# Patient Record
Sex: Male | Born: 1980 | Race: White | Hispanic: No | Marital: Married | Smoking: Current every day smoker
Health system: Southern US, Community
[De-identification: ages and names within clinical notes are randomized; demographics above are authoritative.]

---

## 2015-05-07 ENCOUNTER — Emergency Department (HOSPITAL_COMMUNITY): Payer: BLUE CROSS/BLUE SHIELD

## 2015-05-07 ENCOUNTER — Emergency Department (HOSPITAL_COMMUNITY)
Admission: EM | Admit: 2015-05-07 | Discharge: 2015-05-07 | Disposition: A | Discharge status: Home / Self Care | Payer: BLUE CROSS/BLUE SHIELD | Attending: Emergency Medicine | Admitting: Emergency Medicine

## 2015-05-07 ENCOUNTER — Encounter (HOSPITAL_COMMUNITY): Payer: Self-pay | Admitting: *Deleted

## 2015-05-07 DIAGNOSIS — Z720 Tobacco use: Secondary | ICD-10-CM | POA: Diagnosis not present

## 2015-05-07 DIAGNOSIS — R42 Dizziness and giddiness: Secondary | ICD-10-CM | POA: Diagnosis not present

## 2015-05-07 DIAGNOSIS — K0889 Other specified disorders of teeth and supporting structures: Secondary | ICD-10-CM

## 2015-05-07 DIAGNOSIS — K029 Dental caries, unspecified: Secondary | ICD-10-CM | POA: Insufficient documentation

## 2015-05-07 DIAGNOSIS — K088 Other specified disorders of teeth and supporting structures: Secondary | ICD-10-CM | POA: Insufficient documentation

## 2015-05-07 DIAGNOSIS — M542 Cervicalgia: Secondary | ICD-10-CM | POA: Insufficient documentation

## 2015-05-07 DIAGNOSIS — R6884 Jaw pain: Secondary | ICD-10-CM | POA: Diagnosis present

## 2015-05-07 DIAGNOSIS — K047 Periapical abscess without sinus: Secondary | ICD-10-CM | POA: Insufficient documentation

## 2015-05-07 DIAGNOSIS — R51 Headache: Secondary | ICD-10-CM | POA: Diagnosis not present

## 2015-05-07 LAB — CBC WITH DIFFERENTIAL/PLATELET
BASOS ABS: 0 10*3/uL (ref 0.0–0.1)
Basophils Relative: 0 % (ref 0–1)
Eosinophils Absolute: 0.1 10*3/uL (ref 0.0–0.7)
Eosinophils Relative: 1 % (ref 0–5)
HEMATOCRIT: 42.3 % (ref 39.0–52.0)
Hemoglobin: 14.3 g/dL (ref 13.0–17.0)
LYMPHS ABS: 1.5 10*3/uL (ref 0.7–4.0)
Lymphocytes Relative: 11 % — ABNORMAL LOW (ref 12–46)
MCH: 30.9 pg (ref 26.0–34.0)
MCHC: 33.8 g/dL (ref 30.0–36.0)
MCV: 91.4 fL (ref 78.0–100.0)
MONOS PCT: 7 % (ref 3–12)
Monocytes Absolute: 1 10*3/uL (ref 0.1–1.0)
NEUTROS PCT: 81 % — AB (ref 43–77)
Neutro Abs: 11.3 10*3/uL — ABNORMAL HIGH (ref 1.7–7.7)
Platelets: 242 10*3/uL (ref 150–400)
RBC: 4.63 MIL/uL (ref 4.22–5.81)
RDW: 12.7 % (ref 11.5–15.5)
WBC: 13.8 10*3/uL — ABNORMAL HIGH (ref 4.0–10.5)

## 2015-05-07 LAB — COMPREHENSIVE METABOLIC PANEL
ALT: 24 U/L (ref 17–63)
AST: 22 U/L (ref 15–41)
Albumin: 3.9 g/dL (ref 3.5–5.0)
Alkaline Phosphatase: 59 U/L (ref 38–126)
Anion gap: 10 (ref 5–15)
BUN: 6 mg/dL (ref 6–20)
CO2: 27 mmol/L (ref 22–32)
Calcium: 8.9 mg/dL (ref 8.9–10.3)
Chloride: 103 mmol/L (ref 101–111)
Creatinine, Ser: 0.83 mg/dL (ref 0.61–1.24)
GFR calc Af Amer: >60 mL/min (ref >60)
GFR calc non Af Amer: >60 mL/min (ref >60)
Glucose, Bld: 96 mg/dL (ref 65–99)
POTASSIUM: 3.6 mmol/L (ref 3.5–5.1)
Sodium: 140 mmol/L (ref 135–145)
Total Bilirubin: 0.7 mg/dL (ref 0.3–1.2)
Total Protein: 7.8 g/dL (ref 6.5–8.1)

## 2015-05-07 MED ORDER — CLINDAMYCIN HCL 150 MG PO CAPS: 450.0000 mg | ORAL_CAPSULE | Freq: Three times a day (TID) | ORAL | Status: AC

## 2015-05-07 MED ORDER — IOHEXOL 300 MG/ML  SOLN
80.0000 mL | Freq: Once | INTRAMUSCULAR | Status: AC | PRN
  Administered 2015-05-07: 80 mL via INTRAVENOUS

## 2015-05-07 MED ORDER — LIDOCAINE-EPINEPHRINE (PF) 2 %-1:200000 IJ SOLN: 20.0000 mL | Freq: Once | INTRAMUSCULAR | Status: DC

## 2015-05-07 MED ORDER — CLINDAMYCIN HCL 150 MG PO CAPS
450.0000 mg | ORAL_CAPSULE | Freq: Once | ORAL | Status: AC
Start: 2015-05-07 — End: 2015-05-07
  Administered 2015-05-07: 450 mg via ORAL
  Filled 2015-05-07: qty 3

## 2015-05-07 MED ORDER — LIDOCAINE-EPINEPHRINE (PF) 2 %-1:200000 IJ SOLN
10.0000 mL | Freq: Once | INTRAMUSCULAR | Status: AC
  Administered 2015-05-07: 10 mL
  Filled 2015-05-07: qty 20

## 2015-05-07 MED ORDER — KETOROLAC TROMETHAMINE 60 MG/2ML IM SOLN
30.0000 mg | Freq: Once | INTRAMUSCULAR | Status: AC
  Administered 2015-05-07: 30 mg via INTRAMUSCULAR
  Filled 2015-05-07: qty 2

## 2015-05-07 NOTE — Discharge Instructions (Signed)
Please call your doctor for a followup appointment within 24-48 hours. When you talk to your doctor please let them know that you were seen in the emergency department and have them acquire all of your records so that they can discuss the findings with you and formulate a treatment plan to fully care for your new and ongoing problems. Please follow-up with health and wellness Center Please follow-up with dentist and oral surgeon Please take antibiotics as prescribed Please apply warm compressions and massage Please follow-up with your dentist, teeth will need to be extracted and thoroughly cleaned Please continue to monitor symptoms closely and if symptoms are to worsen or change (fever greater than 101, chills, sweating, nausea, vomiting, chest pain, shortness of breathe, difficulty breathing, weakness, numbness, tingling, worsening or changes to pain pattern, facial swelling, changes to skin colored, warmth to the touch, inability to open and close the jaw line, swelling underneath the tongue, changes to voice, difficulty swallowing, visual changes, headache, dizziness, fainting) please report back to the Emergency Department immediately.    Dental Abscess A dental abscess is a collection of infected fluid (pus) from a bacterial infection in the inner part of the tooth (pulp). It usually occurs at the end of the tooth's root.  CAUSES   Severe tooth decay.  Trauma to the tooth that allows bacteria to enter into the pulp, such as a broken or chipped tooth. SYMPTOMS   Severe pain in and around the infected tooth.  Swelling and redness around the abscessed tooth or in the mouth or face.  Tenderness.  Pus drainage.  Bad breath.  Bitter taste in the mouth.  Difficulty swallowing.  Difficulty opening the mouth.  Nausea.  Vomiting.  Chills.  Swollen neck glands. DIAGNOSIS   A medical and dental history will be taken.  An examination will be performed by tapping on the abscessed  tooth.  X-rays may be taken of the tooth to identify the abscess. TREATMENT The goal of treatment is to eliminate the infection. You may be prescribed antibiotic medicine to stop the infection from spreading. A root canal may be performed to save the tooth. If the tooth cannot be saved, it may be pulled (extracted) and the abscess may be drained.  HOME CARE INSTRUCTIONS  Only take over-the-counter or prescription medicines for pain, fever, or discomfort as directed by your caregiver.  Rinse your mouth (gargle) often with salt water ( tsp salt in 8 oz [250 ml] of warm water) to relieve pain or swelling.  Do not drive after taking pain medicine (narcotics).  Do not apply heat to the outside of your face.  Return to your dentist for further treatment as directed. SEEK MEDICAL CARE IF:  Your pain is not helped by medicine.  Your pain is getting worse instead of better. SEEK IMMEDIATE MEDICAL CARE IF:  You have a fever or persistent symptoms for more than 2-3 days.  You have a fever and your symptoms suddenly get worse.  You have chills or a very bad headache.  You have problems breathing or swallowing.  You have trouble opening your mouth.  You have swelling in the neck or around the eye. Document Released: 11/26/2005 Document Revised: 08/20/2012 Document Reviewed: 03/06/2011 [facility] Patient Information 2015 [place], [place]. This information is not intended to replace advice given to you by your health care provider. Make sure you discuss any questions you have with your health care provider.   Emergency Department Resource Guide 1) Find a Doctor and Pay Out  of Pocket Although you won't have to find out who is covered by your insurance plan, it is a good idea to ask around and get recommendations. You will then need to call the office and see if the doctor you have chosen will accept you as a new patient and what types of options they offer for patients who are self-pay. Some  doctors offer discounts or will set up payment plans for their patients who do not have insurance, but you will need to ask so you aren't surprised when you get to your appointment.  2) Contact Your Local Health Department Not all health departments have doctors that can see patients for sick visits, but many do, so it is worth a call to see if yours does. If you don't know where your local health department is, you can check in your phone book. The CDC also has a tool to help you locate your state's health department, and many state websites also have listings of all of their local health departments.  3) Find a Walk-in Clinic If your illness is not likely to be very severe or complicated, you may want to try a walk in clinic. These are popping up all over the country in pharmacies, drugstores, and shopping centers. They're usually staffed by nurse practitioners or physician assistants that have been trained to treat common illnesses and complaints. They're usually fairly quick and inexpensive. However, if you have serious medical issues or chronic medical problems, these are probably not your best option.  No Primary Care Doctor: - Call Health Connect at  [phone] - they can help you locate a primary care doctor that  accepts your insurance, provides certain services, etc. - Physician Referral Service- [phone]  Chronic Pain Problems: Organization         Address  Phone   Notes  [facility]  [phone] Patients need to be referred by their primary care doctor.   Medication Assistance: Organization         Address  Phone   Notes  [facility] Medication [facility] [address] [place] [place], [place] [address] [phone] --Must be a resident of [facility] -- Must have NO insurance coverage whatsoever (no Medicaid/ Medicare, etc.) -- The pt. MUST have a primary care doctor that directs their care regularly and follows them in the  community   MedAssist  [phone]   [employer]  [phone]    Agencies that provide inexpensive medical care: Organization         Address  Phone   Notes  [facility]  [phone]   [facility]    [phone]   [facility] [address] [place], [place] [address] [phone]   [facility] [address] [place], [place] [phone]   Planned Parenthood    [phone]   [facility]    [phone]   [facility]  [address], [place] Phone:  [phone], Fax:  [phone] Hours of Operation:  9 am - 6 pm, M-F.  Also accepts Medicaid/Medicare and self-pay.  [facility]  [address], [place] Phone: [phone], Fax: [phone]. Hours of Operation:  8:30 am - 5:30 pm, M-F.  Also accepts Medicaid and self-pay.  [facility] [place] [address] [employer] Phone: [phone]   [facility] [address] Natasha Bence [place], [place] [phone], Ext. [phone] Mondays & Thursdays: 7-9 AM.  First 15 patients are seen on a first come, first serve basis.    Medicaid-accepting [facility] Providers:  Organization         Address  Phone   Notes  [facility] [address], [place] [phone] Also accepts self-pay patients.  [facility] [address] Laurell Josephs [place], [place]  [phone]   [facility] [address], [place] [phone]   [facility] [address] [place] [phone]   Renaye Rakers [address], [place]   [phone] Only accepts [place] patients after they have their name applied to their card.   Self-Pay (no insurance) in [facility]:  Organization         Address  Phone   Notes  Sickle Cell Patients, [facility]  Internal Medicine [address] [place], [place] [phone]   [facility] [address] [place], [place] [phone]   [facility] [place]  [address],  [phone]   [facility]/Dr. Osei-Bonsu  [address], [place] or [address], [place] [phone] Phone number for both [place] and [place] locations is the same.  [facility] [address] [place] [phone]   [facility] [address], [place] or [address] [phone]   [facility] [address], [place] [phone], phone; [phone], fax Sees patients 1st and 3rd Saturday of every month.  Must not qualify for public or private insurance (i.e. Medicaid, Medicare, [place] Health Choice, Veterans' Benefits)  Household income should be no more than 200% of the poverty level The clinic cannot treat you if you are pregnant or think you are pregnant  Sexually transmitted diseases are not treated at the clinic.    Dental Care: Organization         Address  Phone  Notes  [facility] Department of [facility] [address] [place], [place] [phone] Accepts children up to age 59 who are enrolled in [place] or [place] Health Choice; pregnant women with a Medicaid card; and children who have applied for Medicaid or [place] Health Choice, but were declined, whose parents can pay a reduced fee at time of service.  [facility] Department of [facility]  [address], [place] [phone] Accepts children up to age 24 who are enrolled in [place] or [place] Health Choice; pregnant women with a Medicaid card; and children who have applied for Medicaid or [place] Health Choice, but were declined, whose parents can pay a reduced fee at time of service.  [facility]  [address] [place], [place] [phone] Patients are seen by appointment only. Walk-ins are not accepted. [facility] will see patients 34 years of age and older. Monday - Tuesday (8am-5pm) Most Wednesdays (8:30-5pm) $30 per visit, cash only  [facility]  [address], [facility] [phone] Patients are seen by appointment only. Walk-ins are not accepted. [facility] will see patients 30 years of age and older. One Wednesday Evening (Monthly: Volunteer Based).  $30 per visit, cash  only  [employer] [facility]  [phone] for adults; Children under age 24, call Graduate Pediatric Dentistry at [phone]. Children aged 624-14, please call [phone] to request a pediatric application.  Dental services are provided in all areas of dental care including fillings, crowns and bridges, complete and partial dentures, implants, gum treatment, root canals, and extractions. Preventive care is also provided. Treatment is provided to both adults and children. Patients are selected via a lottery and there is often a waiting list.   [facility] [address], [place]  [phone] [email or web address]   [facility] [address], [place], [place] [phone], Ext. [phone] Second and Fourth Thursday of each month, opens at 6:30 AM; Clinic ends at 9 AM.  Patients are seen on a first-come first-served basis, and a limited number are seen during each clinic.   [facility]  [address], [place], [place] [phone]) [phone]   Eligibility Requirements You must have lived in [place], [place], or [place] counties for at least the last three months.   You cannot be eligible for state or federal sponsored [place]healthcare insurance, including CIG[employer]eterans Administration, [place]icaid, or [employer]icare.   You generally cannot be eligible for healthcare insurance through your employer.    How to apply: Eligibility screenings are held every  Tuesday and Wednesday afternoon from 1:00 pm until 4:00 pm. You do not need an appointment for the interview!  [facility] [address], [place], [place] [phone]   [facility]  [phone]   [facility]  [phone]   [facility]  [phone]    Behavioral Health Resources in the Community: Intensive Outpatient Programs Organization         Address  Phone  Notes  [facility] [address], [place], [place] [phone]   [facility] [address], [place], [place] [phone]   [facility] [address], [place], [place]  [phone]   [facility] [address],  [place], [place] [phone] or [phone]   Substance Abuse Resources Organization         Address  Phone  Notes  [facility]  [phone]   [facility]  [phone]   [facility]  [phone]   [facility]  [phone]   Residential & Outpatient Substance Abuse Program  [phone]   Psychological Services Organization         Address  Phone  Notes  [facility]  [phone]- [phone]   [facility]  [phone]- [phone]   [facility] [address], [place] [phone] or [phone]    Mobile Crisis Teams Organization         Address  Phone  Notes  [facility], Mobile Crisis Care Unit  [phone]   [facility]  [address] [place], [place] [phone]   Doristine LocksSharon DeEsch [address] [place] [phone]    Self-Help/Support Groups Organization         Address  Phone             Notes  [facility] [place] - variety of support groups  [phone]- [phone] Call for more information  Narcotics Anonymous (NA), [facility] [address], [place]  2 meetings at this location    Statisticianesidential Treatment Programs Organization  Address  Phone  Notes  [facility] [address],    [place]  [phone]   [facility]  [address] [place] [id number], [place], [place]   [place], [place] [phone] Admissions: 8am-3pm M-F  Incentives Substance [place] [address],    [place], [place] [phone]   [facility] [address] [place], [place], [place]   [facility] [address]  [place], [place]   [facility] - Intensive Outpatient [address] Kristeen Mans [address], [place], [place]   [facility] [place].,  [place], [place] [phone] or [phone]   [facility] ([facility]) [address] [place], [place] Accepts Medicaid  [facility] [place] [address]  [place] [phone] Substance Abuse/Addiction Treatment   [facility] Organization         Address  Phone  Notes  [facility]  [phone]   Domenic Schwab, PhD [address] Arlis Porta [place], [place]   [phone] or [phone]   [place], [place] [phone]   [facility] [address] [place], [place] [phone] Insurance/Medicaid/sponsorship through [facility] and Families [address]                                    [place], [place] [phone] [facility] [address], [place] [phone]    Dr. Adele Schilder  [phone]   [facility] Dept. 1) [address] [place] 2) [place] 3)  [place] [address], [place] [phone]  [phone]   [place] [phone] or [phone] (After  Hours)

## 2015-05-07 NOTE — ED Provider Notes (Signed)
CSN: [id number]     Arrival date & time 05/07/15  1109 History  This chart was scribed for Todd Mutton, PA-C, working with Purvis Sheffield, MD by Chestine Spore, ED Scribe. The patient was seen in room TR08C/TR08C at 12:49 PM.    Chief Complaint  Patient presents with  . Jaw Pain      The history is provided by the patient. No language interpreter was used.    Pt is a past drug user and he had seen a dentist in the past to get work done. HPI Comments: Todd Gamble is a 34 y.o. male with no significant medical hx who presents to the Emergency Department complaining of right lower jaw pain onset yesterday. Patient reported that when he went to bed yesterday he noticed swelling to the right jawline, reported that when he woke up this morning the swelling got progressively worse. States that he has a throbbing pain localized to the right mandibular jawline and radiation into the right neck. States that he has associated symptoms of lightheadedness. States that he has been taking ibuprofen with minimal relief. Patient reported that he tried to call his dentist, but states that he is unable to be seen until next week. Patient reports he was seen by a dentist a couple months ago and referred to an oral surgeon which he did not see. Denies drainage, CP, SOB, difficulty breathing, throat closing sensation, tongue swelling, neck pain, neck stiffness, dizziness, blurred vision, sudden loss of vision, fevers, and chills, difficulty swallowing.  PCP none   History reviewed. No pertinent past medical history. History reviewed. No pertinent past surgical history. No family history on file. History  Substance Use Topics  . Smoking status: Current Every Day Smoker -- 0.50 packs/day    Types: Cigarettes  . Smokeless tobacco: Not on file  . Alcohol Use: No    Review of Systems  Constitutional: Negative for fever and chills.  HENT: Positive for facial swelling (right lower jaw). Negative for trouble  swallowing.   Respiratory: Negative for shortness of breath.   Cardiovascular: Negative for chest pain.  Musculoskeletal: Negative for neck pain and neck stiffness.  Neurological: Positive for light-headedness and headaches. Negative for dizziness.      Allergies  Other  Home Medications   Prior to Admission medications   Medication Sig Start Date End Date Taking? Authorizing Provider  clindamycin (CLEOCIN) 150 MG capsule Take 3 capsules (450 mg total) by mouth 3 (three) times daily. 05/07/15   [name], PA-C   BP 127/76 mmHg  Pulse 61  Temp(Src) 98.7 F (37.1 C) (Oral)  Resp 18  Ht 6' (1.829 m)  Wt 210 lb (95.255 kg)  BMI 28.47 kg/m2  SpO2 100% Physical Exam  Constitutional: He is oriented to person, place, and time. He appears well-developed and well-nourished. No distress.  HENT:  Head: Normocephalic and atraumatic.  Facial swelling identified to the right mandibular jawline with tenderness upon palpation. Negative warmth upon palpation or signs of cellulitic infection. Poor dentition identified with numerous dental caries, diagrammed teeth in decaying process. Right mandibular jawline near the canine identified to have swelling and area of induration. Tenderness upon palpation to the right submental region as well as tenderness upon palpation to the right side the neck. Mild trismus noted. Negative uvula deviation, negative uvula swelling. Negative sublingual lesions noted.  Eyes: Conjunctivae and EOM are normal. Pupils are equal, round, and reactive to light. Right eye exhibits no discharge. Left eye exhibits no discharge.  Neck:  Normal range of motion. Neck supple.    Tenderness upon palpation to the right side the neck and right submental region  Cardiovascular: Normal rate, regular rhythm and normal heart sounds.  Exam reveals no friction rub.   No murmur heard. Pulses:      Radial pulses are 2+ on the right side, and 2+ on the left side.  Pulmonary/Chest:  Effort normal and breath sounds normal. No respiratory distress. He has no wheezes. He has no rales.  Patient is able to speak in full sentences without difficulty Negative use of accessory muscles Negative stridor  Musculoskeletal: Normal range of motion.  Neurological: He is alert and oriented to person, place, and time. No cranial nerve deficit. He exhibits normal muscle tone. Coordination normal. GCS eye subscore is 4. GCS verbal subscore is 5. GCS motor subscore is 6.  Skin: Skin is warm and dry. No rash noted. No erythema.  Psychiatric: He has a normal mood and affect. His behavior is normal. Thought content normal.  Nursing note and vitals reviewed.   ED Course  Procedures (including critical care time) DIAGNOSTIC STUDIES: Oxygen Saturation is 98% on RA, nl by my interpretation.    COORDINATION OF CARE: 12:55 PM-Discussed treatment plan which includes CT neck with contrast, CBC, and CMAT with pt at bedside and pt agreed to plan.   Labs Review Labs Reviewed  CBC WITH DIFFERENTIAL/PLATELET - Abnormal; Notable for the following:    WBC 13.8 (*)    Neutrophils Relative % 81 (*)    Neutro Abs 11.3 (*)    Lymphocytes Relative 11 (*)    All other components within normal limits  COMPREHENSIVE METABOLIC PANEL    Imaging Review Ct Soft Tissue Neck W Contrast  05/07/2015   CLINICAL DATA:  Right lower jaw pain  EXAM: CT NECK WITH CONTRAST  TECHNIQUE: Multidetector CT imaging of the neck was performed using the standard protocol following the bolus administration of intravenous contrast.  CONTRAST:  80mL OMNIPAQUE IOHEXOL 300 MG/ML  SOLN  COMPARISON:  None.  FINDINGS: Pharynx and larynx: Negative. No mass identified. No fluid collection.  Salivary glands: The parotid and submandibular glands are normal and symmetric in appearance  Thyroid: Normal appearance of the thyroid gland  Lymph nodes: Prominent right cervical lymph nodes are identified. Index right level 2 node measures 0.8 cm,  image 70 of series 201. Adjacent right level-2 lymph node measures 1.1 cm, image number 65 of series 201. Right level 1 lymph node measures 0.9 cm, image 65 of series 201.  Vascular: Negative.  Limited intracranial: Negative.  Visualized orbits: Negative.  Mastoids and visualized paranasal sinuses: There is mild mucosal thickening involving the anterior right maxillary sinus. The remaining paranasal sinuses and mastoid air cells are clear. No fluid levels identified.  Skeleton: Right lower first and second molar periapical abscesses are identified. There is overlying soft tissue fat stranding and inflammation. Skin thickening and subcutaneous fat stranding overlying the right side of jaw noted.  Upper chest: Visualized portions of the upper chest are unremarkable.  IMPRESSION: 1. Right lower first and second molar periapical abscesses 2. There is overlying cellulitis involving the soft tissues of the right lower jaw. 3. Enlarged right cervical lymph nodes are likely reactive.   Electronically Signed   By: Signa Kell M.D.   On: 05/07/2015 15:42     EKG Interpretation None       1:39 PM Dr. Romeo Apple at bedside assessing patient.  5:07 PM aspiration of right mandibular abscess  performed alongside Dr. Romeo AppleHarrison. Positive release of pus noted with mixture of blood. Patient tolerated procedure well.  Apiration of blood/fluid Performed by: Todd MuttonSciacca, Arlis Everly Consent obtained. Required items: required blood products, implants, devices, and special equipment available Patient identity confirmed: verbally with patient Time out: Immediately prior to procedure a "time out" was called to verify the correct patient, procedure, equipment, support staff and site/side marked as required. Preparation: Patient was prepped and draped in the usual sterile fashion. Patient tolerance: Patient tolerated the procedure well with no immediate complications. Location of aspiration: Right mandible, lower jaw - positive pus  and blood drawback     MDM   Final diagnoses:  Periapical abscess  Pain, dental    Medications  iohexol (OMNIPAQUE) 300 MG/ML solution 80 mL (80 mLs Intravenous Contrast Given 05/07/15 1455)  lidocaine-EPINEPHrine (XYLOCAINE W/EPI) 2 %-1:200000 (PF) injection 10 mL (10 mLs Infiltration Given 05/07/15 1645)  clindamycin (CLEOCIN) capsule 450 mg (450 mg Oral Given 05/07/15 1713)  ketorolac (TORADOL) injection 30 mg (30 mg Intramuscular Given 05/07/15 1712)    Filed Vitals:   05/07/15 1116 05/07/15 1725  BP: 130/87 127/76  Pulse: 81 61  Temp: 98.7 F (37.1 C)   TempSrc: Oral   Resp: 18 18  Height: 6' (1.829 m)   Weight: 210 lb (95.255 kg)   SpO2: 98% 100%    I personally performed the services described in this documentation, which was scribed in my presence. The recorded information has been reviewed and is accurate.   Patient presenting to the ED with right-sided swelling and dental pain has been ongoing starting yesterday. Patient reports that when he woke up this morning the swelling has gone progressively worse. States that he was due to follow-up with oral surgeon naproxen 2 months ago, but did not. Reported that he tried to call his dentist, but will not be able to see him until next week. CBC noted a mildly elevated white blood count of 13.8. Hemoglobin 14.3, hematocrit 42.3. CMP unremarkable. CT soft tissue of neck with contrast noted right lower first and second molar periapical abscesses with overlying cellulitis involving the soft tissues of the right lower jaw. Enlarged right cervical lymph nodes are likely reactive. Patient presenting to the ED with dental pain that started yesterday with worsening swelling today. CT soft tissue of neck with contrast identified periapical abscesses. Aspiration performed alongside Dr. Romeo AppleHarrison with positive drawback of pus and blood. Patient tolerated procedure well. Patient given a dose of antibiotics and Toradol IM in the ED setting.  Patient stable, afebrile. Patient not septic appearing. Negative signs of respiratory distress. Negative findings of retroperitoneal abscess, peritonsillar abscess, Ludwig's angina. Discharged patient. Discharged patient with antibiotics. Referred patient to dentist and oral surgeon. Discussed with patient to apply warm compressions and massage. Discussed with patient to closely monitor symptoms and if symptoms are to worsen or change to report back to the ED - strict return instructions given.  Patient agreed to plan of care, understood, all questions answered.   Todd MuttonMarissa Marlyce Mcdougald, PA-C 05/07/15 1737  Purvis SheffieldForrest Harrison, MD 05/08/15 [phone]

## 2015-05-07 NOTE — ED Notes (Signed)
Pt c/o some jaw R lower jaw pain yesterday.  Today woke up with swollen jaw.  Was referred to oral surgeon 2 months prior and given antibiotics for same, but things improved, so has not seen oral surgeon.

## 2019-08-11 ENCOUNTER — Other Ambulatory Visit: Payer: Self-pay | Admitting: Internal Medicine

## 2019-08-11 DIAGNOSIS — I83893 Varicose veins of bilateral lower extremities with other complications: Secondary | ICD-10-CM

## 2019-08-27 ENCOUNTER — Other Ambulatory Visit: Payer: BLUE CROSS/BLUE SHIELD

## 2019-11-04 ENCOUNTER — Other Ambulatory Visit: Payer: Self-pay

## 2019-11-10 ENCOUNTER — Inpatient Hospital Stay: Admission: RE | Admit: 2019-11-10 | Payer: Self-pay | Source: Ambulatory Visit

## 2019-11-10 ENCOUNTER — Other Ambulatory Visit: Payer: Self-pay

## 2020-02-25 ENCOUNTER — Ambulatory Visit
Admission: RE | Admit: 2020-02-25 | Discharge: 2020-02-25 | Disposition: A | Discharge status: Home / Self Care | Payer: Managed Care, Other (non HMO) | Source: Ambulatory Visit | Attending: Internal Medicine | Admitting: Internal Medicine

## 2020-02-25 ENCOUNTER — Other Ambulatory Visit: Payer: Self-pay | Admitting: Internal Medicine

## 2020-02-25 ENCOUNTER — Other Ambulatory Visit: Payer: Self-pay | Admitting: Diagnostic Radiology

## 2020-02-25 DIAGNOSIS — I83893 Varicose veins of bilateral lower extremities with other complications: Secondary | ICD-10-CM

## 2020-02-25 NOTE — Consult Note (Signed)
Chief Complaint: Patient was seen in consultation today for varicose veins at the request of Ramachandran,Ajith  Referring Physician(s): Ramachandran,Ajith  History of Present Illness: Todd Gamble is a 39 y.o. male with history of varicose veins for a couple of years.  Patient has been wearing compression socks and compression shorts to treat his symptoms.  Varicose veins are larger on the right leg than the left.  He complains of intermittent pressure and tightness in the right knee and a little bit in the right medial thigh.  He has no significant symptoms in the left lower extremity.  He currently takes ibuprofen as needed to treat the right knee and leg symptoms.  Ibuprofen gives him minimal relief.  He notes mild leg swelling.  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  He wears compression stockings while at work.  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  No history of previous surgeries, trauma or DVT to the lower extremities.  Patient had a small skin blister or boil that he popped in the medial right upper thigh.  Patient said that purulent fluid came out of the sore and there is residual redness in this area.   Allergies: Other  Medications:  Methadone 10 mg 2 tablets a day.              05/07/15   Sciacca, Marissa, PA-C     No family history on file. Social History   Socioeconomic History  . Marital status: Married    Spouse name: Not on file  . Number of children: Not on file  . Years of education: Not on file  . Highest education level: Not on file  Occupational History  . Not on file  Tobacco Use  . Smoking status: Current Every Day Smoker    Packs/day: 0.50    Types: Cigarettes  Substance and Sexual Activity  . Alcohol use: No  . Drug use: Not on file    Comment: past opiae addiciton  . Sexual activity: Not on file  Other Topics Concern  . Not on file  Social  History Narrative  . Not on file   Social Determinants of Health   Financial Resource Strain:   . Difficulty of Paying Living Expenses:   Food Insecurity:   . Worried About Programme researcher, broadcasting/film/video in the Last Year:   . Barista in the Last Year:   Transportation Needs:   . Freight forwarder (Medical):   Marland Kitchen Lack of Transportation (Non-Medical):   Physical Activity:   . Days of Exercise per Week:   . Minutes of Exercise per Session:   Stress:   . Feeling of Stress :   Social Connections:   . Frequency of Communication with Friends and Family:   . Frequency of Social Gatherings with Friends and Family:   . Attends Religious Services:   . Active Member of Clubs or Organizations:   . Attends Banker Meetings:   Marland Kitchen Marital Status:      Review of Systems  Musculoskeletal:       Right knee pressure and tightness.  Skin:       Varicose veins in legs, right side greater than left.    Vital Signs: BP 119/72 (BP Location: Right Arm)   Pulse 69   Temp 98 F (36.7 C)   SpO2 98%   Physical Exam Constitutional:  Appearance: Normal appearance. He is not ill-appearing.  Cardiovascular:     Pulses: Normal pulses.  Musculoskeletal:     Comments: Right leg: Large visible varicosities involving the medial right thigh, anterior right knee and lateral right calf.  Small focal area of redness in the upper medial right thigh compatible with a decompressed skin boil.  There is a small pinhead sized black scab associated with this area.  No significant tenderness in this area.  2+ DP and PT pulses.  No significant swelling.  Left leg: Visible varicosities in the medial left distal thigh and left calf.  Varicosities are smaller than the right leg varicosities.  No areas of discoloration or ulceration.  2+ DP and PT pulses  Neurological:     Mental Status: He is alert.    Imaging: US Venous Img Lower Bilateral  Result Date: 02/25/2020 CLINICAL DATA:  39 year old  with bilateral varicose veins and right leg symptoms. EXAM: BILATERAL LOWER EXTREMITY VENOUS DUPLEX ULTRASOUND TECHNIQUE: Gray-scale sonography with graded compression, as well as color Doppler and duplex ultrasound, were performed to evaluate the deep and superficial veins of both lower extremities. Spectral Doppler was utilized to evaluate flow at rest and with distal augmentation maneuvers. A complete superficial venous insufficiency exam was performed in the upright standing position. I personally performed the technical portion of the exam. COMPARISON:  None. FINDINGS: Right lower extremity: Deep venous system: Normal compressibility, augmentation and color Doppler flow in the right common femoral vein, right femoral vein and right popliteal vein without thrombus. The right saphenofemoral junction is patent. Right profunda femoral vein is patent without thrombus. Superficial venous system: Right saphenofemoral junction is markedly enlarged measuring up to 2.0 cm with 2.4 seconds of reflux. Proximal thigh right GSV measures 1.0 cm with 4.1 seconds of reflux. Mid thigh right GSV measures 0.9 cm with 4.2 seconds of reflux. Large varicose vein comes off the distal right GSV. This varicose vein has 3.9 seconds of reflux. Multiple large varicosities in the distal medial right thigh and right knee. Distal thigh right great saphenous vein has 5.6 seconds of reflux. Right knee great saphenous vein has 5.2 seconds reflux. Proximal calf right GSV has 3.5 seconds of reflux. Mid calf GSV has 3.9 seconds of reflux. Distal calf GSV has no significant reflux. Proximal calf short saphenous vein has 2.4 seconds of reflux. Mid calf short saphenous vein has 2.1 seconds of reflux. Large varicosities along the lateral right calf. Large varicosities in the right leg are compressible without thrombus. Left lower extremity: Deep venous system: Normal compressibility, augmentation and color Doppler flow in the left common femoral vein,  left femoral vein and left popliteal vein without thrombus. The left saphenofemoral junction is patent. Left profunda femoral vein is patent without thrombus. Superficial venous system: Enlargement of the saphenofemoral junction measuring up to 1.2 cm with 5.5 seconds of reflux. Proximal thigh GSV measures 1.0 cm with 6 seconds of reflux. Mid thigh left GSV measures 0.8 cm with 4.9 seconds of reflux. Distal thigh left GSV measures 0.8 cm with 5.2 seconds of reflux. Large varicosities coming off the left thigh GSV. Left knee GSV measures 0.7 cm with 1.9 seconds of reflux. Proximal calf left GSV measures 0.8 cm with 3 seconds of reflux. Large varicose vein extending down the medial left calf measures up to 0.5 cm with 2.6 seconds of reflux. The mid and distal calf GSV are small without reflux. Normal caliber of the left short saphenous vein without significant reflux. IMPRESSION: 1. Positive  for superficial venous insufficiency involving the right great saphenous vein and right short saphenous vein. Large varicosities associated with the right great saphenous vein, particularly in the distal thigh. Varicose veins are compressible without thrombus. 2. Positive for superficial venous insufficiency involving the left great saphenous vein. Prominent varicosities associated with the left great saphenous vein. 3.  No evidence of deep venous thrombosis in the lower extremities. Electronically Signed   By: Richarda Overlie M.D.   On: 02/25/2020 10:27    Labs:  CBC: No results for input(s): WBC, HGB, HCT, PLT in the last 8760 hours.  COAGS: No results for input(s): INR, APTT in the last 8760 hours.  BMP: No results for input(s): NA, K, CL, CO2, GLUCOSE, BUN, CALCIUM, CREATININE, GFRNONAA, GFRAA in the last 8760 hours.  Invalid input(s): CMP  LIVER FUNCTION TESTS: No results for input(s): BILITOT, AST, ALT, ALKPHOS, PROT, ALBUMIN in the last 8760 hours.  TUMOR MARKERS: No results for input(s): AFPTM, CEA, CA199,  CHROMGRNA in the last 8760 hours.  Assessment and Plan:  Bilateral lower extremity superficial venous insufficiency.  There is superficial venous insufficiency involving the right great saphenous vein and right short saphenous vein.  Superficial venous insufficiency involving the left great saphenous vein.  Large varicosities in both lower extremities without thrombus.  CEAP classification in both lower extremities is C2, Ep, As, Pr.  At this time, the patient is only having symptoms in the right lower extremity.  Patient is using ibuprofen to treat the symptoms in the right knee and right thigh.  The patient has already been using compression stockings and has failed conservative therapy.  Patient is a candidate for endovascular ablation of the right great saphenous vein and right short saphenous vein.  He will also need ultrasound-guided foam sclerotherapy for the large varicosities in the right leg.  We discussed endovascular ablation of the superficial veins in depth.  Patient understands that wearing the compression stockings regularly after the ablation is very important for a good treatment.  Due to the symptoms in the right lower extremity, the patient would like to proceed with treatment to the right lower extremity.  We will arrange for endovascular laser treatment to the right great saphenous vein and follow-up with endovascular laser treatment to the right short saphenous vein 1 month later.  Anticipate needing ultrasound-guided foam sclerotherapy as well.  Thank you for this interesting consult.  I greatly enjoyed meeting Aeron Donaghey and look forward to participating in their care.  A copy of this report was sent to the requesting provider on this date.  Electronically Signed: Arn Medal 02/25/2020, 10:36 AM   I spent a total of  30 Minutes   in face to face in clinical consultation, greater than 50% of which was counseling/coordinating care for bilateral lower extremity venous  insufficiency.

## 2020-03-04 ENCOUNTER — Telehealth: Payer: Self-pay | Admitting: *Deleted

## 2020-03-04 NOTE — Telephone Encounter (Signed)
Lmom for patient his [employer] will not authorize the varicose vein procedure, his plan excludes this treatment. I lmom that he can payout of pocket and setup a pmt plan/vm

## 2020-07-16 ENCOUNTER — Emergency Department (HOSPITAL_COMMUNITY): Payer: Managed Care, Other (non HMO)

## 2020-07-16 ENCOUNTER — Emergency Department (HOSPITAL_COMMUNITY)
Admission: EM | Admit: 2020-07-16 | Discharge: 2020-07-16 | Disposition: A | Discharge status: Home / Self Care | Payer: Managed Care, Other (non HMO) | Attending: Emergency Medicine | Admitting: Emergency Medicine

## 2020-07-16 ENCOUNTER — Encounter (HOSPITAL_COMMUNITY): Payer: Self-pay | Admitting: Emergency Medicine

## 2020-07-16 ENCOUNTER — Other Ambulatory Visit: Payer: Self-pay

## 2020-07-16 DIAGNOSIS — R102 Pelvic and perineal pain: Secondary | ICD-10-CM | POA: Insufficient documentation

## 2020-07-16 DIAGNOSIS — W11XXXA Fall on and from ladder, initial encounter: Secondary | ICD-10-CM | POA: Insufficient documentation

## 2020-07-16 DIAGNOSIS — S0101XA Laceration without foreign body of scalp, initial encounter: Secondary | ICD-10-CM

## 2020-07-16 DIAGNOSIS — Y929 Unspecified place or not applicable: Secondary | ICD-10-CM | POA: Insufficient documentation

## 2020-07-16 DIAGNOSIS — Y939 Activity, unspecified: Secondary | ICD-10-CM | POA: Insufficient documentation

## 2020-07-16 DIAGNOSIS — Y999 Unspecified external cause status: Secondary | ICD-10-CM | POA: Diagnosis not present

## 2020-07-16 DIAGNOSIS — W19XXXA Unspecified fall, initial encounter: Secondary | ICD-10-CM

## 2020-07-16 DIAGNOSIS — S80812A Abrasion, left lower leg, initial encounter: Secondary | ICD-10-CM | POA: Diagnosis not present

## 2020-07-16 DIAGNOSIS — F17210 Nicotine dependence, cigarettes, uncomplicated: Secondary | ICD-10-CM | POA: Diagnosis not present

## 2020-07-16 LAB — I-STAT CHEM 8, ED
BUN: 20 mg/dL (ref 6–20)
Calcium, Ion: 1.29 mmol/L (ref 1.15–1.40)
Chloride: 102 mmol/L (ref 98–111)
Creatinine, Ser: 0.9 mg/dL (ref 0.61–1.24)
Glucose, Bld: 94 mg/dL (ref 70–99)
HCT: 44 % (ref 39.0–52.0)
Hemoglobin: 15 g/dL (ref 13.0–17.0)
Potassium: 5.5 mmol/L — ABNORMAL HIGH (ref 3.5–5.1)
Sodium: 139 mmol/L (ref 135–145)
TCO2: 30 mmol/L (ref 22–32)

## 2020-07-16 LAB — CBC WITH DIFFERENTIAL/PLATELET
Abs Immature Granulocytes: 0.04 10*3/uL (ref 0.00–0.07)
Basophils Absolute: 0.1 10*3/uL (ref 0.0–0.1)
Basophils Relative: 0 %
Eosinophils Absolute: 0.2 10*3/uL (ref 0.0–0.5)
Eosinophils Relative: 1 %
HCT: 44.5 % (ref 39.0–52.0)
Hemoglobin: 15.3 g/dL (ref 13.0–17.0)
Immature Granulocytes: 0 %
Lymphocytes Relative: 22 %
Lymphs Abs: 2.7 10*3/uL (ref 0.7–4.0)
MCH: 32.1 pg (ref 26.0–34.0)
MCHC: 34.4 g/dL (ref 30.0–36.0)
MCV: 93.3 fL (ref 80.0–100.0)
Monocytes Absolute: 0.8 10*3/uL (ref 0.1–1.0)
Monocytes Relative: 7 %
Neutro Abs: 8.9 10*3/uL — ABNORMAL HIGH (ref 1.7–7.7)
Neutrophils Relative %: 70 %
Platelets: 265 10*3/uL (ref 150–400)
RBC: 4.77 MIL/uL (ref 4.22–5.81)
RDW: 12.4 % (ref 11.5–15.5)
WBC: 12.7 10*3/uL — ABNORMAL HIGH (ref 4.0–10.5)
nRBC: 0 % (ref 0.0–0.2)

## 2020-07-16 LAB — BASIC METABOLIC PANEL
Anion gap: 10 (ref 5–15)
BUN: 16 mg/dL (ref 6–20)
CO2: 25 mmol/L (ref 22–32)
Calcium: 9.9 mg/dL (ref 8.9–10.3)
Chloride: 101 mmol/L (ref 98–111)
Creatinine, Ser: 0.86 mg/dL (ref 0.61–1.24)
GFR calc Af Amer: >60 mL/min (ref >60)
GFR calc non Af Amer: >60 mL/min (ref >60)
Glucose, Bld: 99 mg/dL (ref 70–99)
Potassium: 5.1 mmol/L (ref 3.5–5.1)
Sodium: 136 mmol/L (ref 135–145)

## 2020-07-16 MED ORDER — SODIUM CHLORIDE (PF) 0.9 % IJ SOLN
INTRAMUSCULAR | Status: AC
  Filled 2020-07-16: qty 50

## 2020-07-16 MED ORDER — ACETAMINOPHEN 325 MG PO TABS
650.0000 mg | ORAL_TABLET | Freq: Once | ORAL | Status: AC
Start: 2020-07-16 — End: 2020-07-16
  Administered 2020-07-16: 650 mg via ORAL
  Filled 2020-07-16: qty 2

## 2020-07-16 MED ORDER — IOHEXOL 300 MG/ML  SOLN
100.0000 mL | Freq: Once | INTRAMUSCULAR | Status: AC | PRN
  Administered 2020-07-16: 100 mL via INTRAVENOUS

## 2020-07-16 NOTE — Discharge Instructions (Signed)
[facility] out in 7-10 days.  Tylenol or Ibuprofen for pain  Return for new or worsening symptoms.  Compression, wrap, heat and elevation to left leg. Follow up with Orthopedics

## 2020-07-16 NOTE — ED Notes (Signed)
Gave patient ace wrap to wrap leg when he gets home and gets a shower

## 2020-07-16 NOTE — ED Provider Notes (Signed)
I assumed care of patient at shift change, please see note from previous team for full H and P.  Briefly patient is here for evaluation after a fall from the 5th step ladder shortly PTA.    Physical Exam  BP 122/77 (BP Location: Left Arm)   Pulse 64   Temp 98.2 F (36.8 C) (Oral)   Resp 18   SpO2 100%   Physical Exam Vitals and nursing note reviewed.  Constitutional:      General: He is not in acute distress.    Appearance: He is well-developed. He is not diaphoretic.  HENT:     Head: Normocephalic.  Eyes:     General: No scleral icterus. Cardiovascular:     Rate and Rhythm: Normal rate.  Pulmonary:     Effort: Pulmonary effort is normal. No respiratory distress.     Breath sounds: No stridor.  Abdominal:     General: There is no distension.  Musculoskeletal:        General: No deformity.     Cervical back: Normal range of motion and neck supple.  Skin:    General: Skin is warm and dry.  Neurological:     Mental Status: He is alert.     Motor: No abnormal muscle tone.     Comments: Patient is awake and alert, interacts appropriately.  Psychiatric:        Mood and Affect: Mood normal.        Behavior: Behavior normal.     ED Course/Procedures     Procedures  DG Ribs Bilateral W/Chest  Result Date: 07/16/2020 CLINICAL DATA:  Fall off ladder EXAM: BILATERAL RIBS AND CHEST - 4+ VIEW COMPARISON:  None. FINDINGS: No fracture or other bone lesions are seen involving the ribs. There is no evidence of pneumothorax or pleural effusion. Both lungs are clear. Heart size and mediastinal contours are within normal limits. IMPRESSION: Negative. Electronically Signed   By: Jonna Clark M.D.   On: 07/16/2020 19:27   DG Pelvis 1-2 Views  Result Date: 07/16/2020 CLINICAL DATA:  Larey Seat 6 feet off of a ladder.  Pelvic pain. EXAM: PELVIS - 1-2 VIEW COMPARISON:  None. FINDINGS: There is widening of the symphysis pubis which may indicate pelvic injury. Slight widening of the right SI joint.  No discrete fracture is demonstrated. No dislocation of the hips. IMPRESSION: Widening of the symphysis pubis and right SI joint suggesting pelvic injury. Electronically Signed   By: Burman Nieves M.D.   On: 07/16/2020 19:28   DG Tibia/Fibula Left  Result Date: 07/16/2020 CLINICAL DATA:  Larey Seat 6 feet off a ladder. Pain to the posterior left tib-fib. EXAM: LEFT TIBIA AND FIBULA - 2 VIEW COMPARISON:  None. FINDINGS: The left tibia and fibula appear intact. No acute fracture or dislocation is identified. No focal bone lesions. Soft tissues are unremarkable. IMPRESSION: Negative. Electronically Signed   By: Burman Nieves M.D.   On: 07/16/2020 19:29   CT Head Wo Contrast  Result Date: 07/16/2020 CLINICAL DATA:  Fall from ladder, laceration to posterior head EXAM: CT HEAD WITHOUT CONTRAST CT CERVICAL SPINE WITHOUT CONTRAST TECHNIQUE: Multidetector CT imaging of the head and cervical spine was performed following the standard protocol without intravenous contrast. Multiplanar CT image reconstructions of the cervical spine were also generated. COMPARISON:  None. FINDINGS: CT HEAD FINDINGS Brain: No evidence of acute infarction, hemorrhage, hydrocephalus, extra-axial collection or mass lesion/mass effect. Vascular: No hyperdense vessel or unexpected calcification. Skull: Normal. Negative for fracture or focal  lesion. Sinuses/Orbits: No acute finding. Other: None. CT CERVICAL SPINE FINDINGS Alignment: Normal. Skull base and vertebrae: No acute fracture. No primary bone lesion or focal pathologic process. Soft tissues and spinal canal: No prevertebral fluid or swelling. No visible canal hematoma. Disc levels:  Intact. Upper chest: Negative. Other: None. IMPRESSION: 1. No acute intracranial pathology. 2. No fracture or static subluxation of the cervical spine. Electronically Signed   By: Lauralyn Primes M.D.   On: 07/16/2020 19:33   CT Cervical Spine Wo Contrast  Result Date: 07/16/2020 CLINICAL DATA:  Fall from  ladder, laceration to posterior head EXAM: CT HEAD WITHOUT CONTRAST CT CERVICAL SPINE WITHOUT CONTRAST TECHNIQUE: Multidetector CT imaging of the head and cervical spine was performed following the standard protocol without intravenous contrast. Multiplanar CT image reconstructions of the cervical spine were also generated. COMPARISON:  None. FINDINGS: CT HEAD FINDINGS Brain: No evidence of acute infarction, hemorrhage, hydrocephalus, extra-axial collection or mass lesion/mass effect. Vascular: No hyperdense vessel or unexpected calcification. Skull: Normal. Negative for fracture or focal lesion. Sinuses/Orbits: No acute finding. Other: None. CT CERVICAL SPINE FINDINGS Alignment: Normal. Skull base and vertebrae: No acute fracture. No primary bone lesion or focal pathologic process. Soft tissues and spinal canal: No prevertebral fluid or swelling. No visible canal hematoma. Disc levels:  Intact. Upper chest: Negative. Other: None. IMPRESSION: 1. No acute intracranial pathology. 2. No fracture or static subluxation of the cervical spine. Electronically Signed   By: Lauralyn Primes M.D.   On: 07/16/2020 19:33   CT CHEST ABDOMEN PELVIS W CONTRAST  Result Date: 07/16/2020 CLINICAL DATA:  Fall 6 ft from ladder EXAM: CT CHEST, ABDOMEN, AND PELVIS WITH CONTRAST TECHNIQUE: Multidetector CT imaging of the chest, abdomen and pelvis was performed following the standard protocol during bolus administration of intravenous contrast. CONTRAST:  OMNIPAQUE IOHEXOL 300 MG/ML  SOLN COMPARISON:  None. FINDINGS: CT CHEST FINDINGS Cardiovascular: Heart is normal size. Aorta is normal caliber. Mediastinum/Nodes: No mediastinal, hilar, or axillary adenopathy. Trachea and esophagus are unremarkable. Thyroid unremarkable. Lungs/Pleura: Lungs are clear. No focal airspace opacities or suspicious nodules. No effusions. No pneumothorax Musculoskeletal: Chest wall soft tissues are unremarkable. No acute bony abnormality. CT ABDOMEN PELVIS  FINDINGS Hepatobiliary: No hepatic injury or perihepatic hematoma. Gallbladder is unremarkable Pancreas: No focal abnormality or ductal dilatation. Spleen: No splenic injury or perisplenic hematoma. Adrenals/Urinary Tract: No adrenal hemorrhage or renal injury identified. Bladder is unremarkable. Stomach/Bowel: Normal appendix. Stomach, large and small bowel grossly unremarkable. Vascular/Lymphatic: No evidence of aneurysm or adenopathy. Reproductive: No visible focal abnormality. Other: No free fluid or free air. Musculoskeletal: No acute bony abnormality. IMPRESSION: No acute findings or evidence of significant traumatic injury in the chest, abdomen or pelvis. Electronically Signed   By: Charlett Nose M.D.   On: 07/16/2020 22:17     MDM  Plan to follow up on CT scans of chest and abdomen.   CT scans of chest and abdomen are reviewed, no acute findings or evidence of significant injury from the fall today.  Patient is reevaluated, he is sitting comfortably in no obvious distress, voices he is ready to go home at this time.  Return precautions were discussed with patient who states their understanding.  At the time of discharge patient denied any unaddressed complaints or concerns.  Patient is agreeable for discharge home.  Note: Portions of this report may have been transcribed using voice recognition software. Every effort was made to ensure accuracy; however, inadvertent computerized transcription errors may be present  Cristina Gong, [place] 07/17/20 9675    Milagros Loll, MD 07/18/20 [phone]

## 2020-07-16 NOTE — ED Triage Notes (Addendum)
Patient reports fall off ladder approximately six feet. Laceration to posterior head. Denies blood thinner and LOC. C/o left leg pain. Denies neck and back pain.

## 2020-07-16 NOTE — ED Provider Notes (Signed)
[facility]-EMERGENCY DEPT Provider Note   CSN: [id number] Arrival date & time: 07/16/20  1659     History Chief Complaint  Patient presents with  . Fall  . Leg Pain  . Head Injury    Todd Gamble is a 39 y.o. male with past medical history who presents for evaluation of fall. Patient was on approximately the fifth step of a ladder when he fell backwards. Hit his head on the concrete. He suffered laceration to his posterior scalp. He denies any neck pain. He denies LOC, anticoagulation. States he has left lower extremity pain to his posterior calf. He has been ambulatory without difficulty. Took Ibuprofen prior to coming to ED. Unknown last tetanus. He denies any headache, lightheadedness, dizziness, chest pain, shortness of breath abdominal pain, diarrhea, dysuria. Denies any bony tenderness to midline back, pelvis. Denies additional aggravating or relieving factors. Rates pain a 7/10.   History obtained from patient and past medical history. No interpretor was used.  Patient states under no circumstances does he want opioid pain medication.  HPI     History reviewed. No pertinent past medical history.  There are no problems to display for this patient.   History reviewed. No pertinent surgical history.     No family history on file.  Social History   Tobacco Use  . Smoking status: Current Every Day Smoker    Packs/day: 0.50    Types: Cigarettes  Substance Use Topics  . Alcohol use: No  . Drug use: Not on file    Comment: past opiae addiciton    Home Medications Prior to Admission medications   Medication Sig Start Date End Date Taking? Authorizing Provider  clindamycin (CLEOCIN) 150 MG capsule Take 3 capsules (450 mg total) by mouth 3 (three) times daily. 05/07/15   Sciacca, Ashok Cordia, PA-C    Allergies    Other  Review of Systems   Review of Systems  Constitutional: Negative.   HENT: Negative.   Respiratory: Negative.     Cardiovascular: Negative.   Gastrointestinal: Negative.   Genitourinary: Negative.   Musculoskeletal: Negative for arthralgias, back pain, gait problem, joint swelling, myalgias, neck pain and neck stiffness.       Left posterior calf pain  Skin: Negative.   Neurological: Positive for headaches. Negative for dizziness, tremors, seizures, syncope, facial asymmetry, speech difficulty, weakness, light-headedness and numbness.  All other systems reviewed and are negative.   Physical Exam Updated Vital Signs BP 122/77 (BP Location: Left Arm)   Pulse 64   Temp 98.2 F (36.8 C) (Oral)   Resp 18   SpO2 100%   Physical Exam Vitals and nursing note reviewed.  Constitutional:      General: He is not in acute distress.    Appearance: He is well-developed. He is not ill-appearing, toxic-appearing or diaphoretic.  HENT:     Head: Normocephalic and atraumatic.     Nose: Nose normal.     Mouth/Throat:     Mouth: Mucous membranes are moist.  Eyes:     Pupils: Pupils are equal, round, and reactive to light.  Cardiovascular:     Rate and Rhythm: Normal rate and regular rhythm.     Pulses: Normal pulses.     Heart sounds: Normal heart sounds.  Pulmonary:     Effort: Pulmonary effort is normal. No respiratory distress.     Breath sounds: Normal breath sounds and air entry.  Abdominal:     General: Bowel sounds are normal. There is  no distension.     Palpations: Abdomen is soft.     Tenderness: There is no abdominal tenderness. There is no right CVA tenderness, left CVA tenderness, guarding or rebound. Negative signs include Murphy's sign and McBurney's sign.     Hernia: No hernia is present.     Comments: Soft, nontender without rebound or guarding  Musculoskeletal:        General: Normal range of motion.     Cervical back: Normal range of motion and neck supple.     Comments: No bony tenderness to thoracic or lumbar spine.  Pelvis stable, nontender palpation.  No bony tenderness  bilateral femurs, knee.  Tenderness to left posterior calf.  There is a large abrasion.  No fluctuance or induration.  Able to plantarflex and dorsiflex bilateral ankles at difficulty.  Wiggles toes without difficulty.  Skin:    General: Skin is warm and dry.     Capillary Refill: Capillary refill takes less than 2 seconds.     Comments: Laceration to posterior scalp. No bleeding, drainage  Neurological:     General: No focal deficit present.     Mental Status: He is alert.     Cranial Nerves: Cranial nerves are intact.     Sensory: Sensation is intact.     Motor: Motor function is intact.     Coordination: Coordination is intact.     Gait: Gait is intact.     Comments: Cn 2-12 grossly intact Ambulatory without difficulty     ED Results / Procedures / Treatments   Labs (all labs ordered are listed, but only abnormal results are displayed) Labs Reviewed  CBC WITH DIFFERENTIAL/PLATELET - Abnormal; Notable for the following components:      Result Value   WBC 12.7 (*)    Neutro Abs 8.9 (*)    All other components within normal limits  I-STAT CHEM 8, ED - Abnormal; Notable for the following components:   Potassium 5.5 (*)    All other components within normal limits  BASIC METABOLIC PANEL    EKG None  Radiology DG Ribs Bilateral W/Chest  Result Date: 07/16/2020 CLINICAL DATA:  Fall off ladder EXAM: BILATERAL RIBS AND CHEST - 4+ VIEW COMPARISON:  None. FINDINGS: No fracture or other bone lesions are seen involving the ribs. There is no evidence of pneumothorax or pleural effusion. Both lungs are clear. Heart size and mediastinal contours are within normal limits. IMPRESSION: Negative. Electronically Signed   By: Jonna Clark M.D.   On: 07/16/2020 19:27   DG Pelvis 1-2 Views  Result Date: 07/16/2020 CLINICAL DATA:  Larey Seat 6 feet off of a ladder.  Pelvic pain. EXAM: PELVIS - 1-2 VIEW COMPARISON:  None. FINDINGS: There is widening of the symphysis pubis which may indicate pelvic  injury. Slight widening of the right SI joint. No discrete fracture is demonstrated. No dislocation of the hips. IMPRESSION: Widening of the symphysis pubis and right SI joint suggesting pelvic injury. Electronically Signed   By: Burman Nieves M.D.   On: 07/16/2020 19:28   DG Tibia/Fibula Left  Result Date: 07/16/2020 CLINICAL DATA:  Larey Seat 6 feet off a ladder. Pain to the posterior left tib-fib. EXAM: LEFT TIBIA AND FIBULA - 2 VIEW COMPARISON:  None. FINDINGS: The left tibia and fibula appear intact. No acute fracture or dislocation is identified. No focal bone lesions. Soft tissues are unremarkable. IMPRESSION: Negative. Electronically Signed   By: Burman Nieves M.D.   On: 07/16/2020 19:29   CT Head  Wo Contrast  Result Date: 07/16/2020 CLINICAL DATA:  Fall from ladder, laceration to posterior head EXAM: CT HEAD WITHOUT CONTRAST CT CERVICAL SPINE WITHOUT CONTRAST TECHNIQUE: Multidetector CT imaging of the head and cervical spine was performed following the standard protocol without intravenous contrast. Multiplanar CT image reconstructions of the cervical spine were also generated. COMPARISON:  None. FINDINGS: CT HEAD FINDINGS Brain: No evidence of acute infarction, hemorrhage, hydrocephalus, extra-axial collection or mass lesion/mass effect. Vascular: No hyperdense vessel or unexpected calcification. Skull: Normal. Negative for fracture or focal lesion. Sinuses/Orbits: No acute finding. Other: None. CT CERVICAL SPINE FINDINGS Alignment: Normal. Skull base and vertebrae: No acute fracture. No primary bone lesion or focal pathologic process. Soft tissues and spinal canal: No prevertebral fluid or swelling. No visible canal hematoma. Disc levels:  Intact. Upper chest: Negative. Other: None. IMPRESSION: 1. No acute intracranial pathology. 2. No fracture or static subluxation of the cervical spine. Electronically Signed   By: Lauralyn Primes M.D.   On: 07/16/2020 19:33   CT Cervical Spine Wo Contrast  Result  Date: 07/16/2020 CLINICAL DATA:  Fall from ladder, laceration to posterior head EXAM: CT HEAD WITHOUT CONTRAST CT CERVICAL SPINE WITHOUT CONTRAST TECHNIQUE: Multidetector CT imaging of the head and cervical spine was performed following the standard protocol without intravenous contrast. Multiplanar CT image reconstructions of the cervical spine were also generated. COMPARISON:  None. FINDINGS: CT HEAD FINDINGS Brain: No evidence of acute infarction, hemorrhage, hydrocephalus, extra-axial collection or mass lesion/mass effect. Vascular: No hyperdense vessel or unexpected calcification. Skull: Normal. Negative for fracture or focal lesion. Sinuses/Orbits: No acute finding. Other: None. CT CERVICAL SPINE FINDINGS Alignment: Normal. Skull base and vertebrae: No acute fracture. No primary bone lesion or focal pathologic process. Soft tissues and spinal canal: No prevertebral fluid or swelling. No visible canal hematoma. Disc levels:  Intact. Upper chest: Negative. Other: None. IMPRESSION: 1. No acute intracranial pathology. 2. No fracture or static subluxation of the cervical spine. Electronically Signed   By: Lauralyn Primes M.D.   On: 07/16/2020 19:33    Procedures .Marland KitchenLaceration Repair  Date/Time: 07/16/2020 9:08 PM Performed by: Linwood Dibbles, PA-C Authorized by: Linwood Dibbles, PA-C   Consent:    Consent obtained:  Verbal   Consent given by:  Patient   Risks discussed:  Infection, need for additional repair, pain, poor cosmetic result and poor wound healing   Alternatives discussed:  No treatment and delayed treatment Universal protocol:    Procedure explained and questions answered to patient or proxy's satisfaction: yes     Relevant documents present and verified: yes     Test results available and properly labeled: yes     Imaging studies available: yes     Required blood products, implants, devices, and special equipment available: yes     Site/side marked: yes     Immediately prior to  procedure, a time out was called: yes     Patient identity confirmed:  Verbally with patient Anesthesia (see MAR for exact dosages):    Anesthesia method:  Topical application   Topical anesthetic:  EMLA cream Laceration details:    Location:  Scalp   Scalp location:  Crown   Length (cm):  1   Depth (mm):  3 Repair type:    Repair type:  Intermediate Pre-procedure details:    Preparation:  Patient was prepped and draped in usual sterile fashion and imaging obtained to evaluate for foreign bodies Exploration:    Hemostasis achieved with:  Direct pressure  Wound exploration: wound explored through full range of motion and entire depth of wound probed and visualized     Wound extent: no foreign bodies/material noted, no muscle damage noted, no underlying fracture noted and no vascular damage noted     Contaminated: no   Treatment:    Area cleansed with:  Betadine   Amount of cleaning:  Extensive   Irrigation solution:  Sterile saline   Irrigation method:  Pressure wash Skin repair:    Repair method:  Staples   Number of staples:  2 Approximation:    Approximation:  Loose Post-procedure details:    Dressing:  Open (no dressing)   Patient tolerance of procedure:  Tolerated well, no immediate complications .Ortho Injury Treatment  Date/Time: 07/16/2020 9:09 PM Performed by: Ralph Leyden A, PA-C Authorized by: Linwood Dibbles, PA-C   Consent:    Consent obtained:  Verbal   Consent given by:  Patient   Risks discussed:  Fracture, nerve damage, restricted joint movement, vascular damage, stiffness, recurrent dislocation and irreducible dislocation   Alternatives discussed:  Alternative treatment, immobilization, referral, no treatment and delayed treatmentInjury location: lower leg Location details: left lower leg Injury type: soft tissue Pre-procedure neurovascular assessment: neurovascularly intact Pre-procedure distal perfusion: normal Pre-procedure neurological  function: normal Pre-procedure range of motion: normal  Anesthesia: Local anesthesia used: no  Patient sedated: NoImmobilization: Ace compression wrap. Post-procedure neurovascular assessment: post-procedure neurovascularly intact Post-procedure distal perfusion: normal Post-procedure neurological function: normal Post-procedure range of motion: normal Patient tolerance: patient tolerated the procedure well with no immediate complications    (including critical care time)  Medications Ordered in ED Medications  sodium chloride (PF) 0.9 % injection (has no administration in time range)  acetaminophen (TYLENOL) tablet 650 mg (650 mg Oral Given 07/16/20 1856)  iohexol (OMNIPAQUE) 300 MG/ML solution 100 mL (100 mLs Intravenous Contrast Given 07/16/20 2150)    ED Course  I have reviewed the triage vital signs and the nursing notes.  Pertinent labs & imaging results that were available during my care of the patient were reviewed by me and considered in my medical decision making (see chart for details).  27 old presents for evaluation after fall.  Occurred just PTA.  Was on a ladder and fell approximately 5 to 6 feet to his back.  Hit posterior head.  Has laceration without bleeding or drainage.  Denies c-collar.  No midline spinal tenderness, pelvis stable, nontender palpation.  He does have some pain to his left posterior calf some overlying abrasion.  Bony tenderness.  He is ambulatory with out difficulty.  He is neurovascularly intact.  Plan on imaging.  CT head without acute findings CT cervical without acute findings Plain film tib-fib left without acute fracture, dislocation or soft tissue swelling Pelvis with possible widened symphysis, question pelvic injury Ribs bilateral without acute fracture  Discussed pelvic films with patient and family in room.  We will plan on labs, CT imaging.  Patient declines any bracing at this time as he states he has no pain to his  pelvis.  Laceration to scalp closed with # 2 staples.  Left calf abrasion and soft tissue injury with compression wrap.  He does not have active hematoma currently on exam however given mechanism of injury is possible he may develop in future.  I suggest elevation, heat, compression and close watch on this.  No additional lacerations to suture.  He will follow-up outpatient with orthopedics.  Labs and further imaging personally reviewed interpreted CBC WBC at  12.7 Metabolic panel without electrolyte, renal or liver abnormality  Patient pending CT chest abdomen pelvis due to possible widened pubic symphysis on x-ray.  [facility] [place] who will follow up on imaging and determine appropriate disposition.     MDM Rules/Calculators/A&P                           Final Clinical Impression(s) / ED Diagnoses Final diagnoses:  Fall  Laceration of scalp, initial encounter    Rx / DC Orders ED Discharge Orders    None       Veleta Yamamoto A, PA-C 07/16/20 2202    Milagros Lollykstra, Richard S, MD 07/18/20 1626

## 2020-10-22 IMAGING — CT CT CERVICAL SPINE W/O CM
3 of 4 series · 9 of 33 positions shown, 11 images · non-contrast
Comparison: None.

CLINICAL DATA: Fall from ladder, laceration to posterior head

EXAM:
CT HEAD WITHOUT CONTRAST
CT CERVICAL SPINE WITHOUT CONTRAST
TECHNIQUE: Multidetector CT imaging of the head and cervical spine was
performed following the standard protocol without intravenous
contrast. Multiplanar CT image reconstructions of the cervical spine
were also generated.

[Series 6: orthogonal bone · axial · 0.23mm/px · z∈[-259,-259]mm · 1 of 114 slices shown, 2 images]
[im 57/114  soft-tissue]
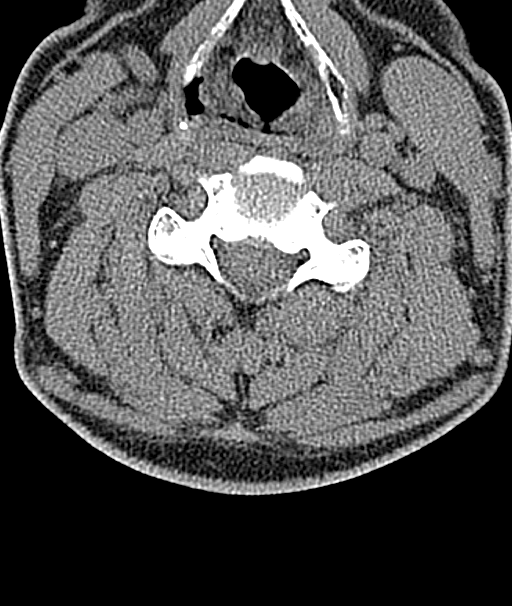
[im 57/114  bone]
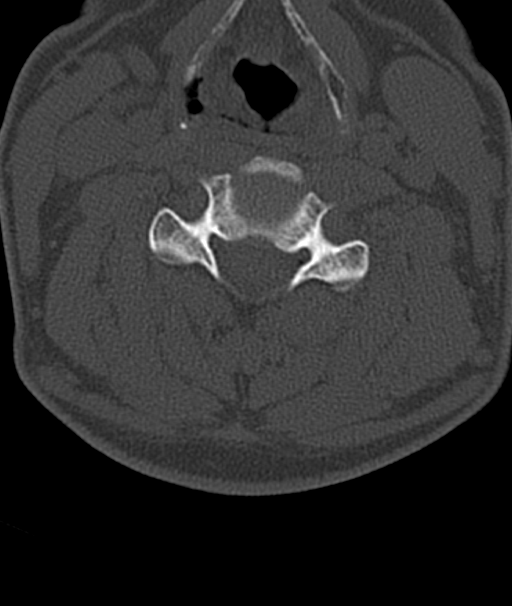

[Series 7: coronal bone · coronal · 0.24mm/px · 3 of 62 slices shown]
[im 13/62  bone]
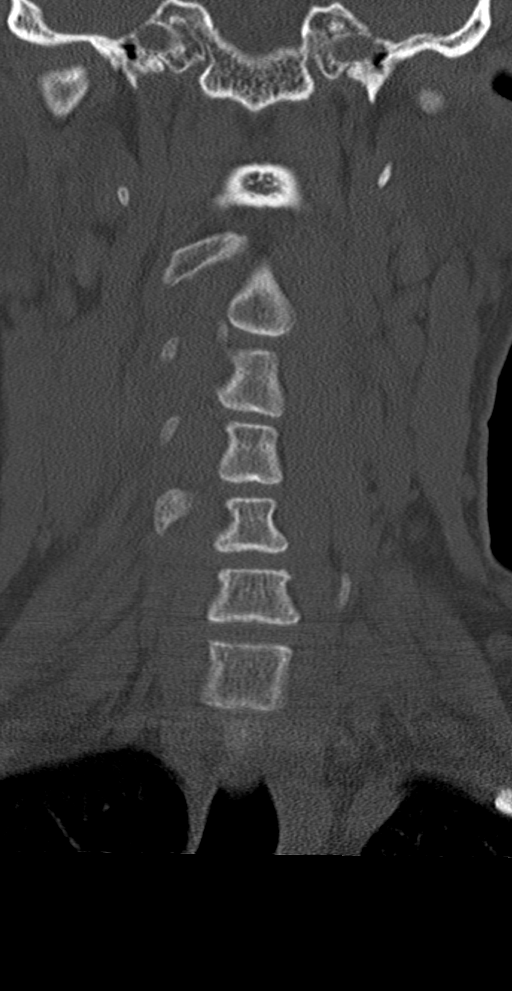
[im 25/62  bone]
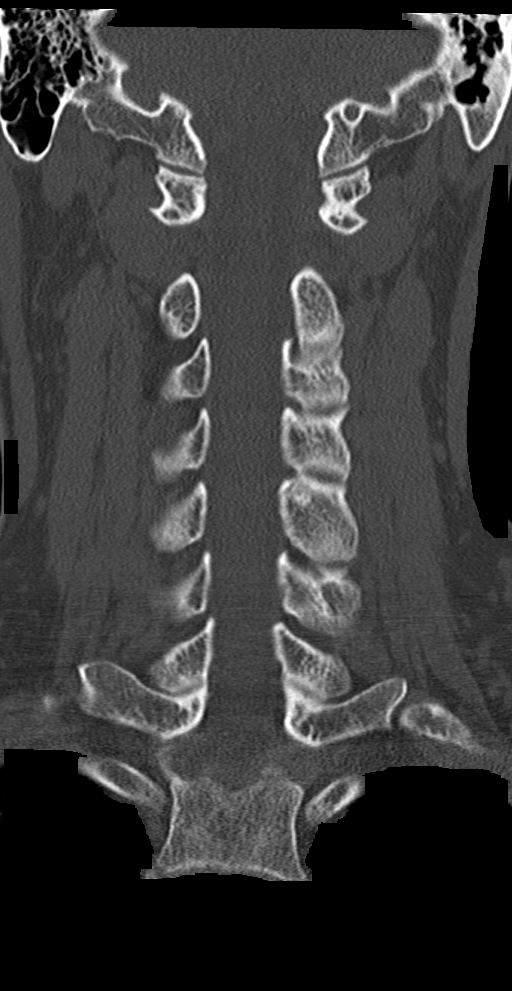
[im 37/62  bone]
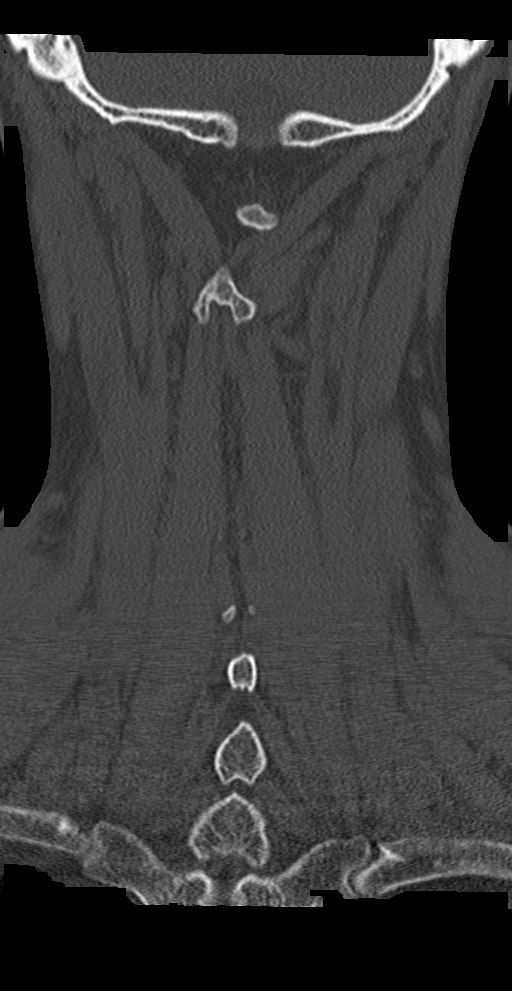

[Series 8: sagittal bone · sagittal · 0.26mm/px · 5 of 57 slices shown, 6 images]
[im 19/57  bone]
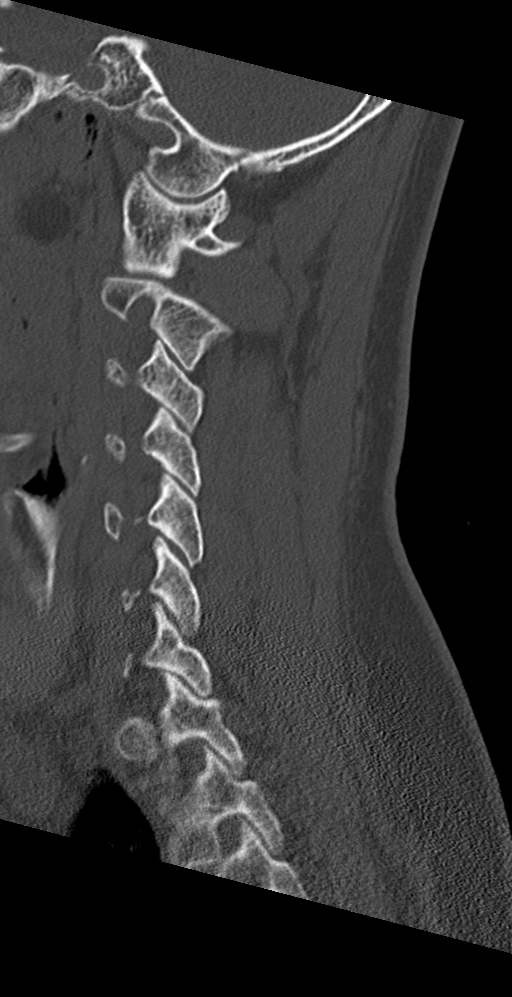
[im 24/57  bone]
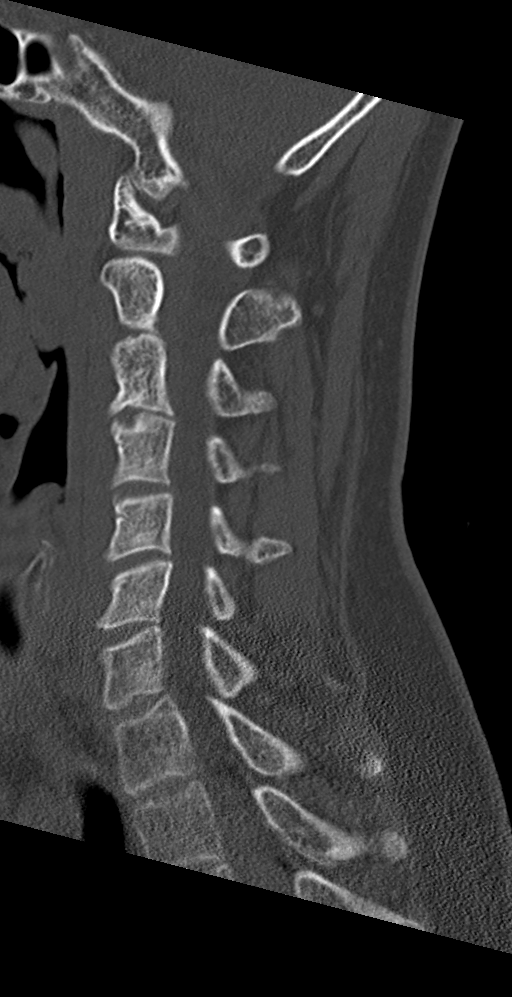
[im 29/57  soft-tissue]
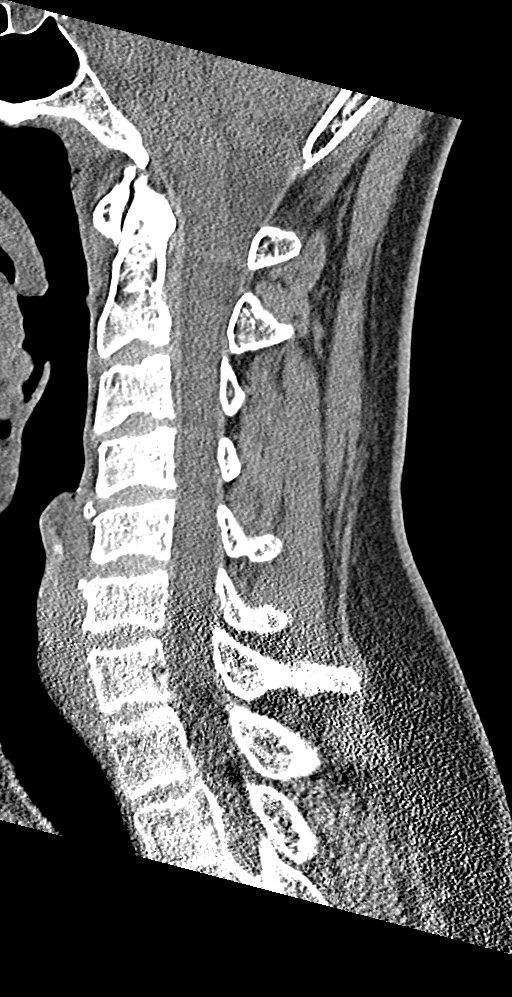
[im 29/57  bone]
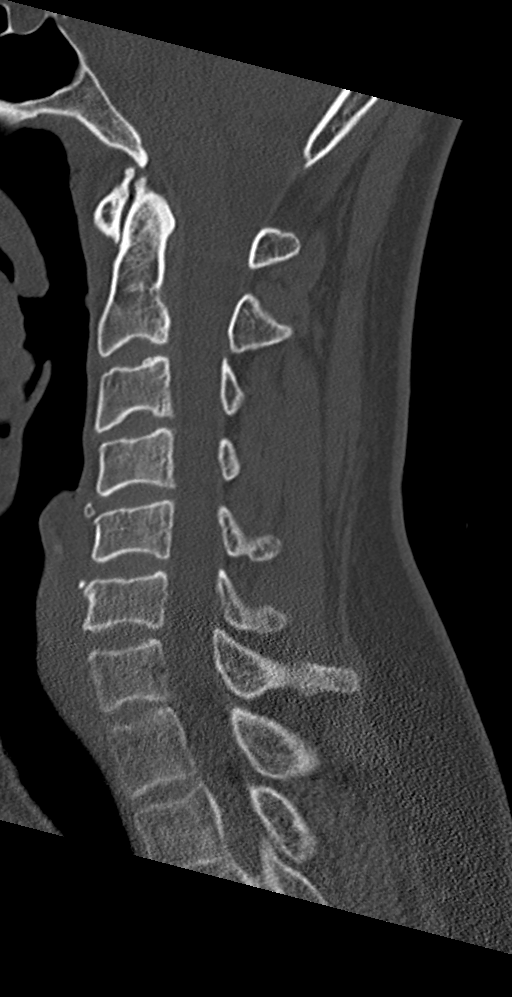
[im 33/57  bone]
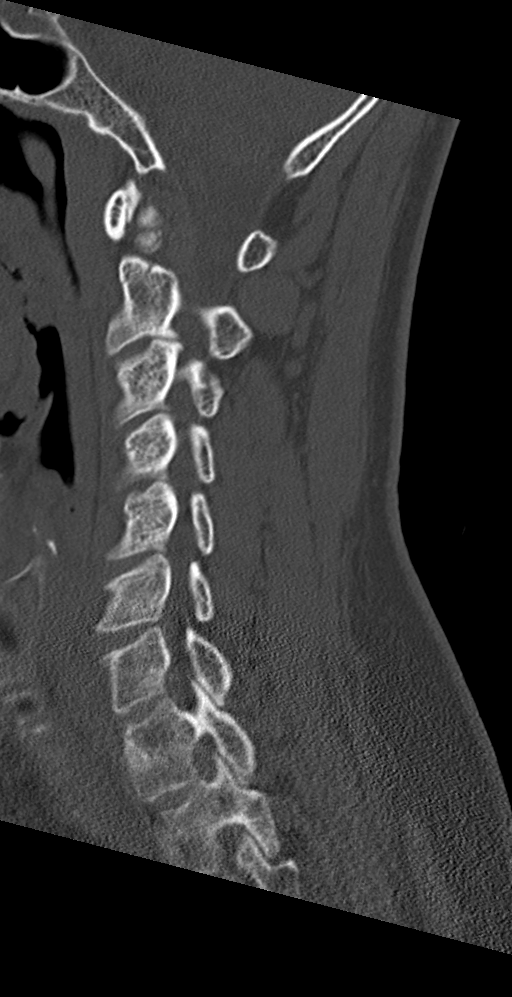
[im 38/57  bone]
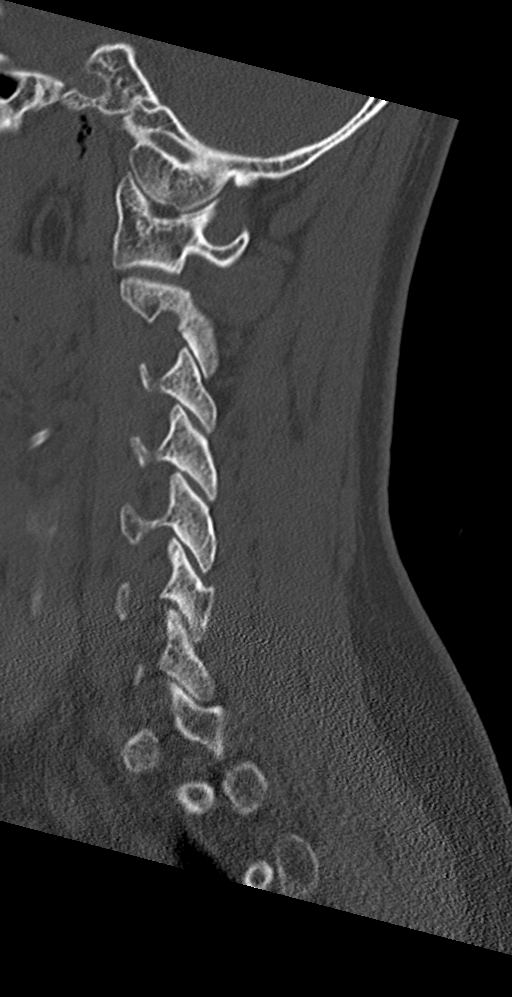

[9 of 33 positions shown; findings below may reference images not displayed]

FINDINGS: CT HEAD FINDINGS

Brain: No evidence of acute infarction, hemorrhage, hydrocephalus,
extra-axial collection or mass lesion/mass effect.

Vascular: No hyperdense vessel or unexpected calcification.

Skull: Normal. Negative for fracture or focal lesion.

Sinuses/Orbits: No acute finding.

Other: None.

CT CERVICAL SPINE FINDINGS

Alignment: Normal.

Skull base and vertebrae: No acute fracture. No primary bone lesion
or focal pathologic process.

Soft tissues and spinal canal: No prevertebral fluid or swelling. No
visible canal hematoma.

Disc levels:  Intact.

Upper chest: Negative.

Other: None.
IMPRESSION: 1. No acute intracranial pathology.
2. No fracture or static subluxation of the cervical spine.

## 2020-10-22 IMAGING — CR DG RIBS W/ CHEST 3+V BILAT
8 series · 8 of 8 positions shown · non-contrast
Comparison: None.

CLINICAL DATA: Fall off ladder

EXAM:
BILATERAL RIBS AND CHEST - 4+ VIEW

[t ribs ap upper left]
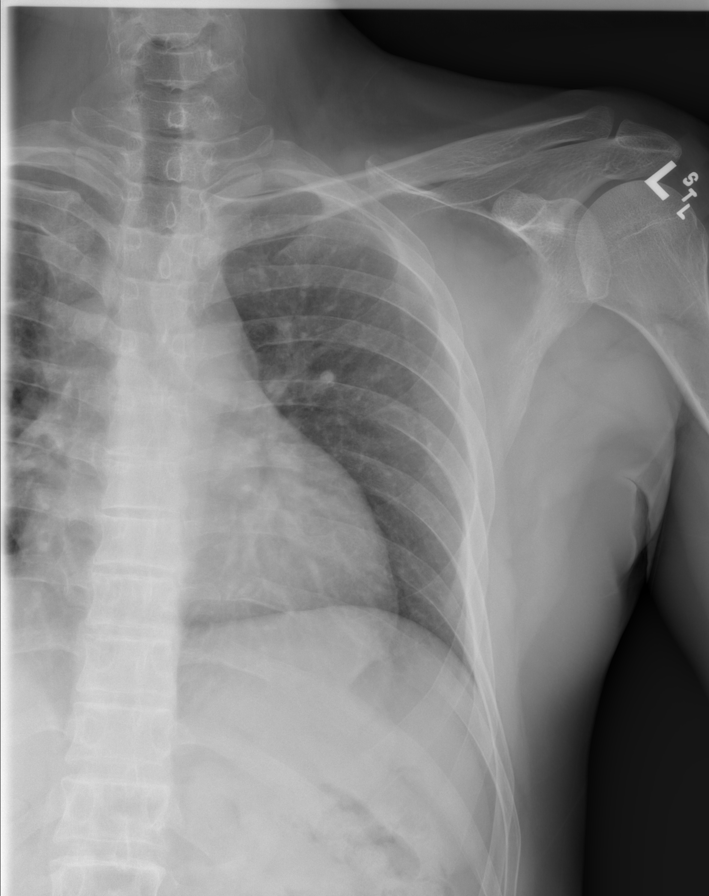

[t ribs ap lower left]
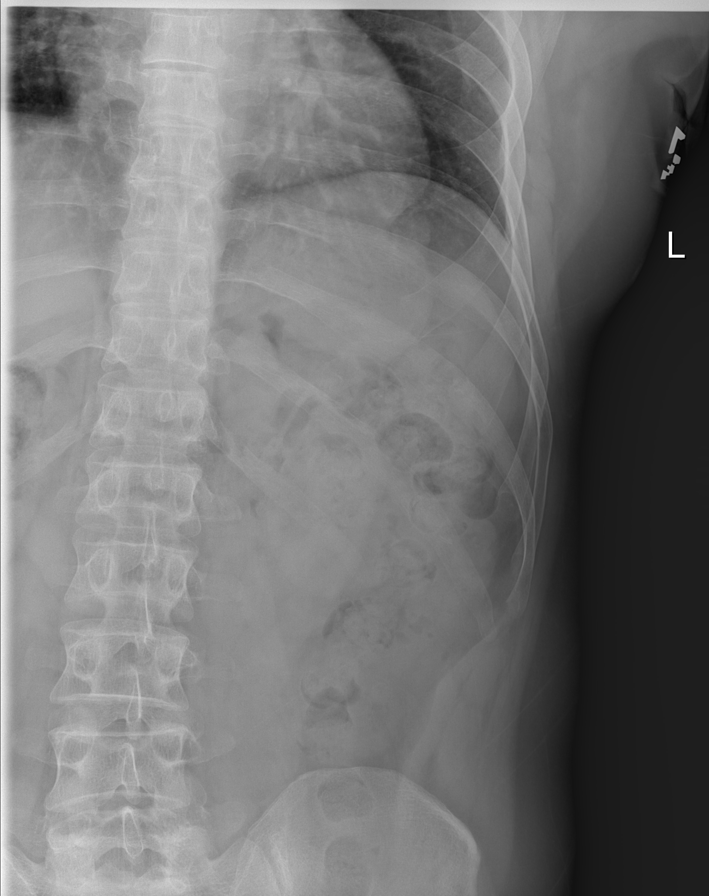

[t ribs ap lower right]
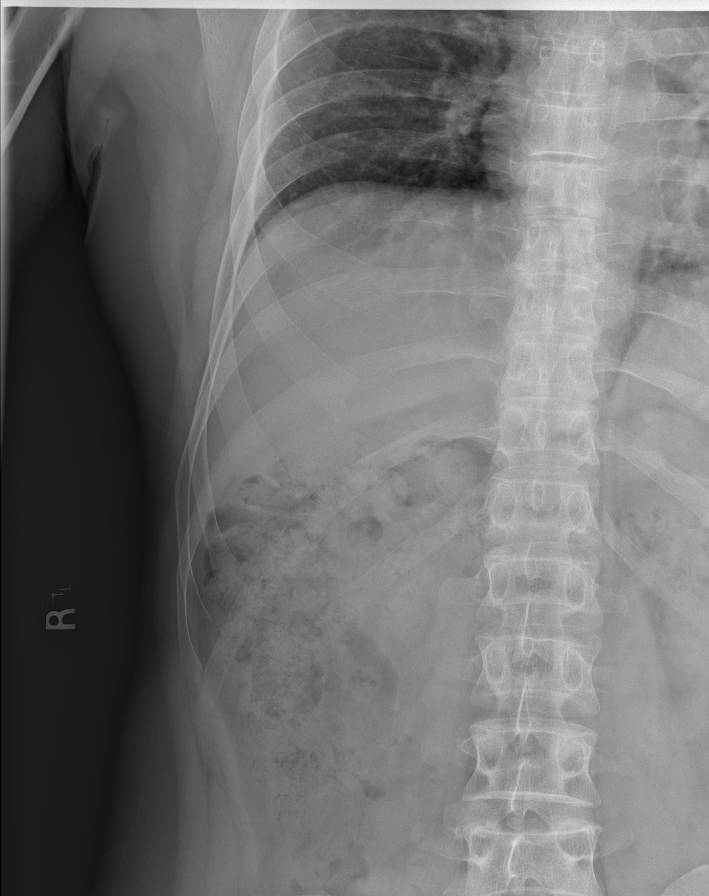

[t ribs rpo right (1 of 3)]
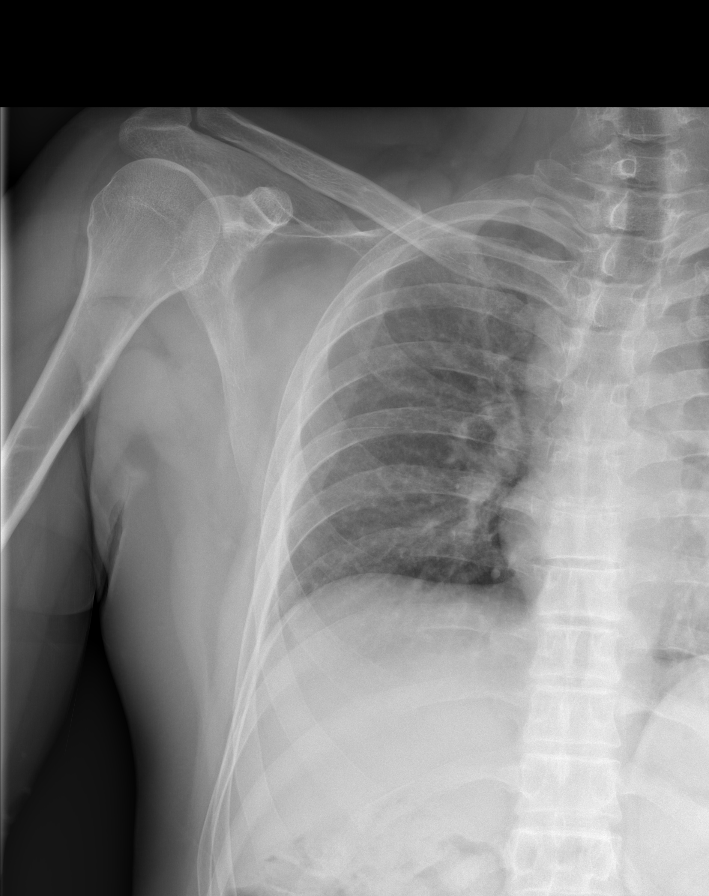

[t ribs rpo right (2 of 3)]
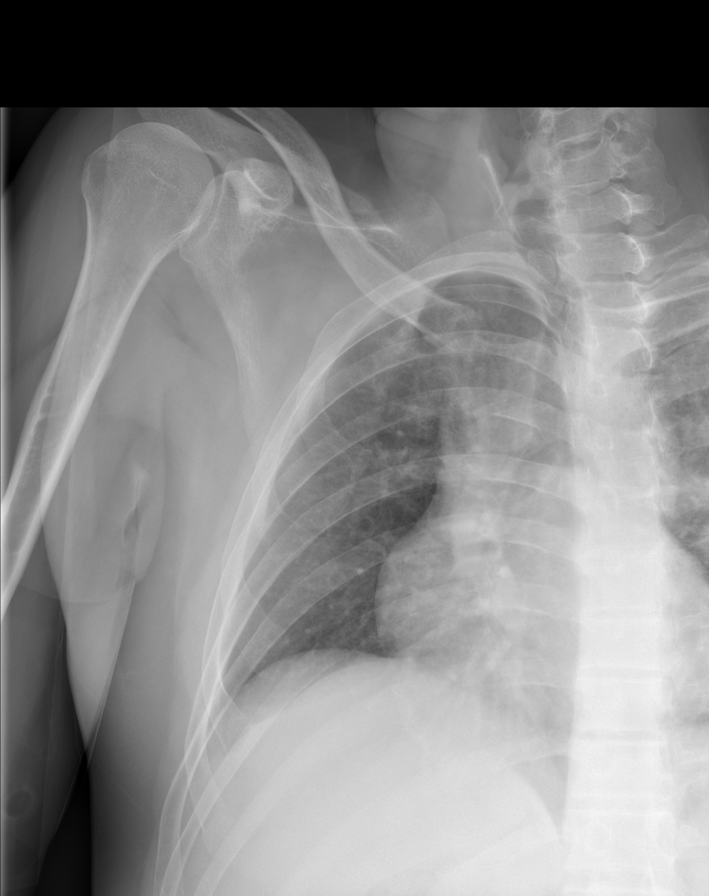

[t ribs rpo right (3 of 3)]
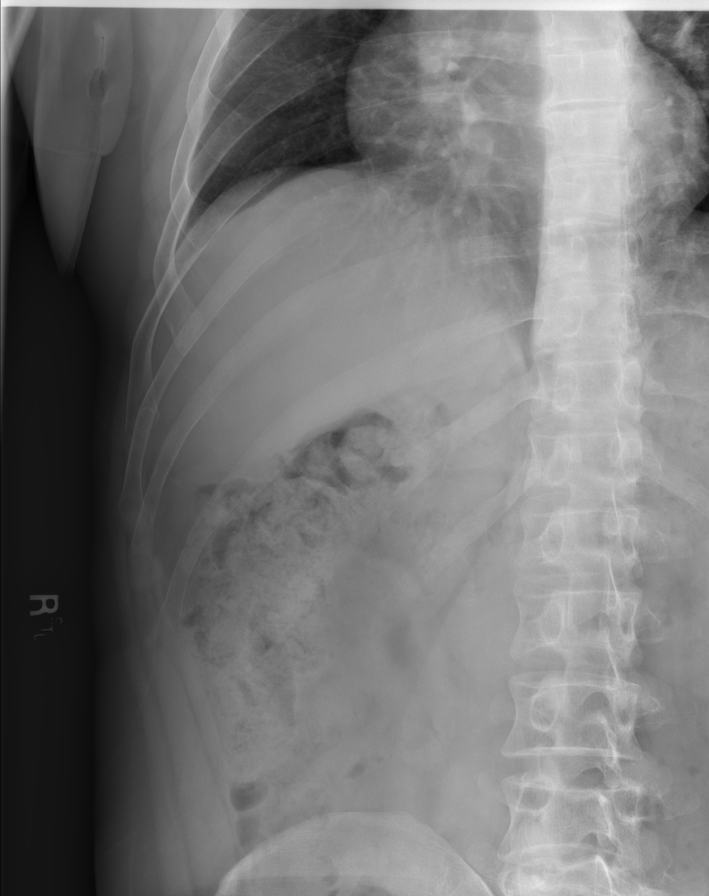

[t ribs lpo left]
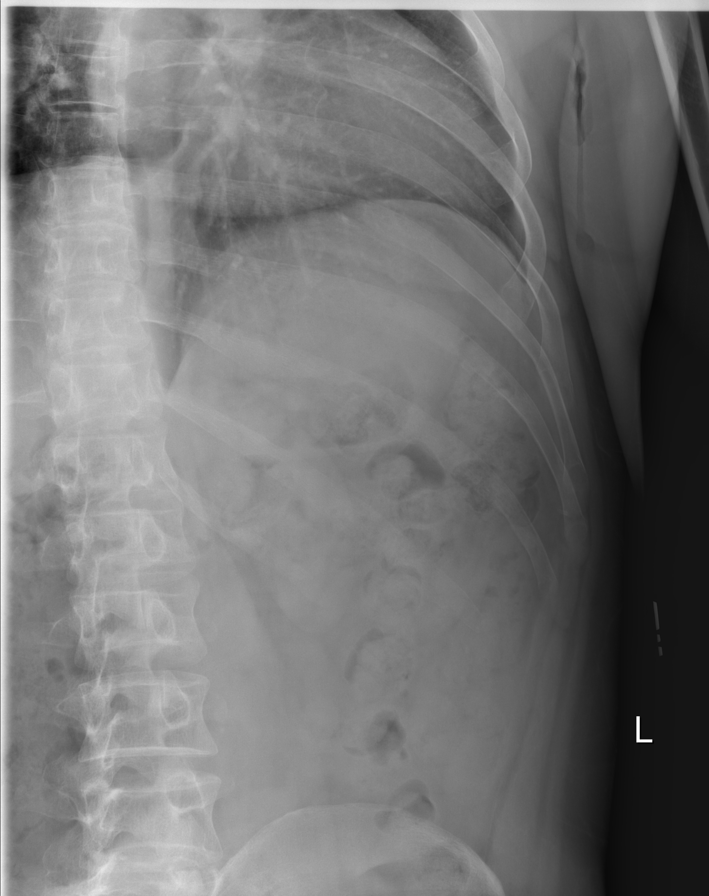

[t ribs ap upper right]
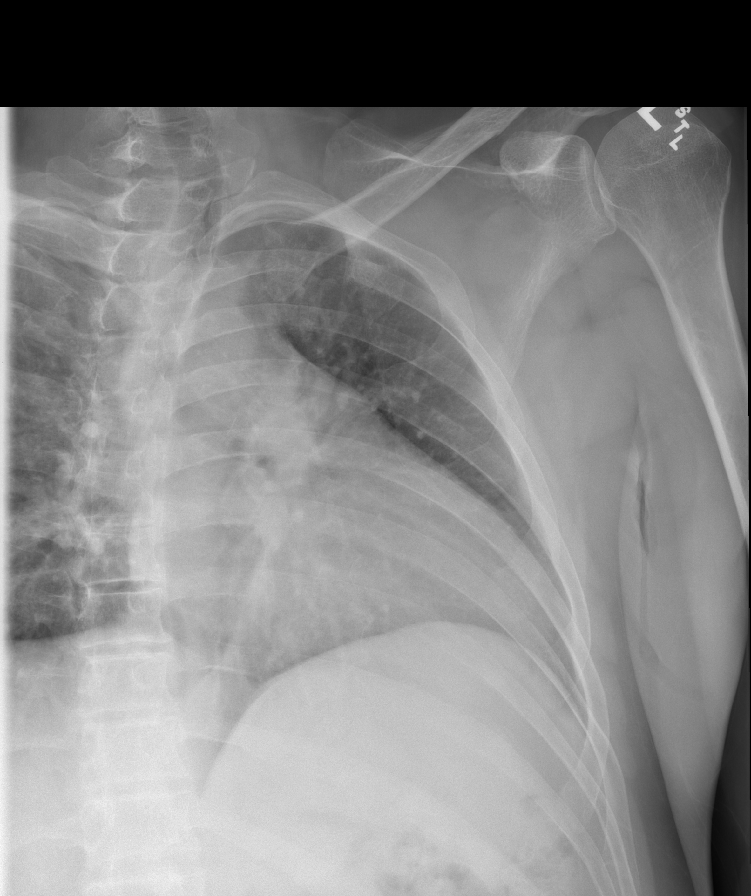

[8 of 8 positions shown; findings below may reference images not displayed]

FINDINGS: No fracture or other bone lesions are seen involving the ribs. There
is no evidence of pneumothorax or pleural effusion. Both lungs are
clear. Heart size and mediastinal contours are within normal limits.
IMPRESSION: Negative.

## 2020-10-26 DIAGNOSIS — N529 Male erectile dysfunction, unspecified: Secondary | ICD-10-CM | POA: Diagnosis not present

## 2020-10-26 DIAGNOSIS — I83893 Varicose veins of bilateral lower extremities with other complications: Secondary | ICD-10-CM | POA: Diagnosis not present

## 2020-10-26 DIAGNOSIS — F17200 Nicotine dependence, unspecified, uncomplicated: Secondary | ICD-10-CM | POA: Diagnosis not present

## 2020-10-26 DIAGNOSIS — Z23 Encounter for immunization: Secondary | ICD-10-CM | POA: Diagnosis not present

## 2021-04-11 DIAGNOSIS — M79672 Pain in left foot: Secondary | ICD-10-CM | POA: Diagnosis not present

## 2021-04-12 ENCOUNTER — Other Ambulatory Visit: Payer: Self-pay | Admitting: Internal Medicine

## 2021-04-12 ENCOUNTER — Ambulatory Visit
Admission: RE | Admit: 2021-04-12 | Discharge: 2021-04-12 | Disposition: A | Discharge status: Home / Self Care | Payer: BC Managed Care – PPO | Source: Ambulatory Visit | Attending: Internal Medicine | Admitting: Internal Medicine

## 2021-04-12 DIAGNOSIS — M79672 Pain in left foot: Secondary | ICD-10-CM

## 2021-04-19 DIAGNOSIS — F1911 Other psychoactive substance abuse, in remission: Secondary | ICD-10-CM | POA: Insufficient documentation

## 2021-04-19 DIAGNOSIS — F17200 Nicotine dependence, unspecified, uncomplicated: Secondary | ICD-10-CM | POA: Insufficient documentation

## 2021-04-19 DIAGNOSIS — I8390 Asymptomatic varicose veins of unspecified lower extremity: Secondary | ICD-10-CM | POA: Insufficient documentation

## 2021-05-04 ENCOUNTER — Other Ambulatory Visit: Payer: Self-pay

## 2021-05-04 DIAGNOSIS — I8393 Asymptomatic varicose veins of bilateral lower extremities: Secondary | ICD-10-CM

## 2021-05-27 DIAGNOSIS — L03116 Cellulitis of left lower limb: Secondary | ICD-10-CM | POA: Insufficient documentation

## 2021-06-06 ENCOUNTER — Ambulatory Visit (HOSPITAL_COMMUNITY): Payer: BC Managed Care – PPO | Attending: Vascular Surgery

## 2021-06-06 DIAGNOSIS — Z0000 Encounter for general adult medical examination without abnormal findings: Secondary | ICD-10-CM | POA: Insufficient documentation

## 2021-06-06 DIAGNOSIS — E291 Testicular hypofunction: Secondary | ICD-10-CM | POA: Insufficient documentation

## 2021-06-06 DIAGNOSIS — N529 Male erectile dysfunction, unspecified: Secondary | ICD-10-CM | POA: Insufficient documentation

## 2021-06-29 ENCOUNTER — Encounter: Payer: Self-pay | Admitting: *Deleted

## 2021-07-19 IMAGING — US US EXTREM LOW VENOUS*L*
1 series · 14 of 24 positions shown · non-contrast
Comparison: None.

CLINICAL DATA: Left foot pain.

EXAM:
LEFT LOWER EXTREMITY VENOUS DOPPLER ULTRASOUND
TECHNIQUE: Gray-scale sonography with compression, as well as color and duplex
ultrasound, were performed to evaluate the deep venous system(s)
from the level of the common femoral vein through the popliteal and
proximal calf veins.

[Series 1: us extrem low venous*left* · 0.08mm/px · 14 of 34 slices shown]
[im 1/34]
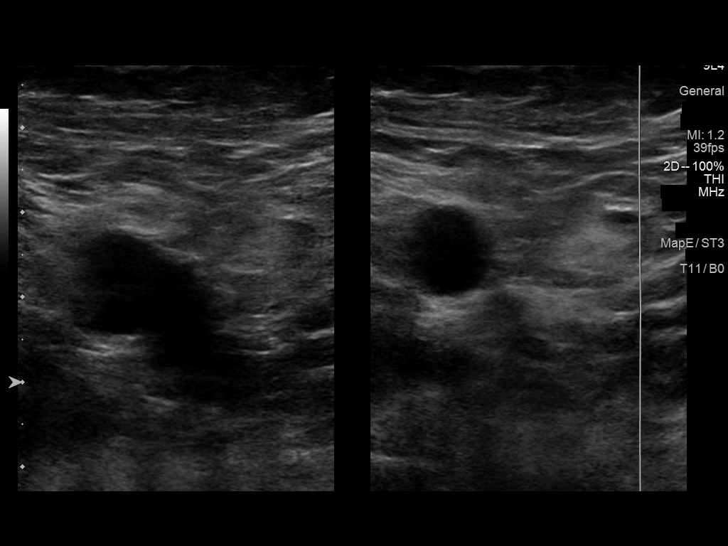
[im 3/34]
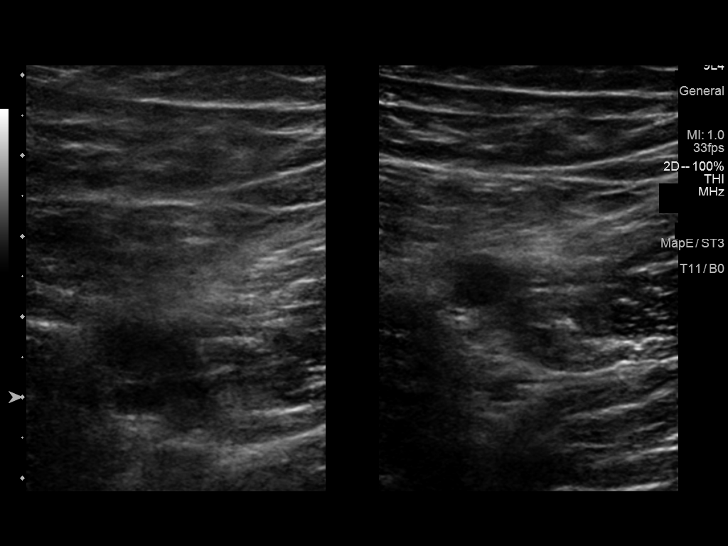
[im 6/34]
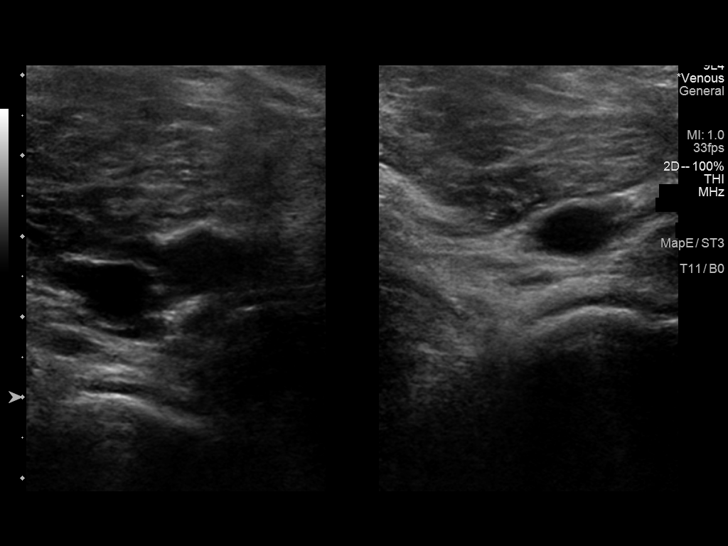
[im 9/34]
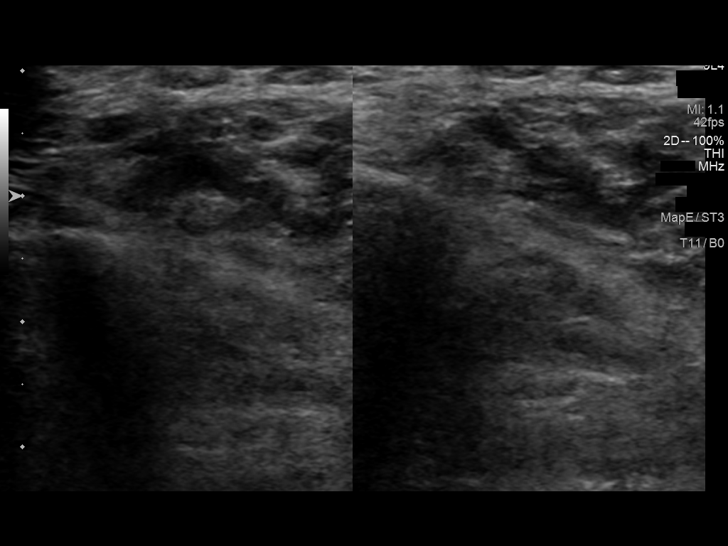
[im 11/34]
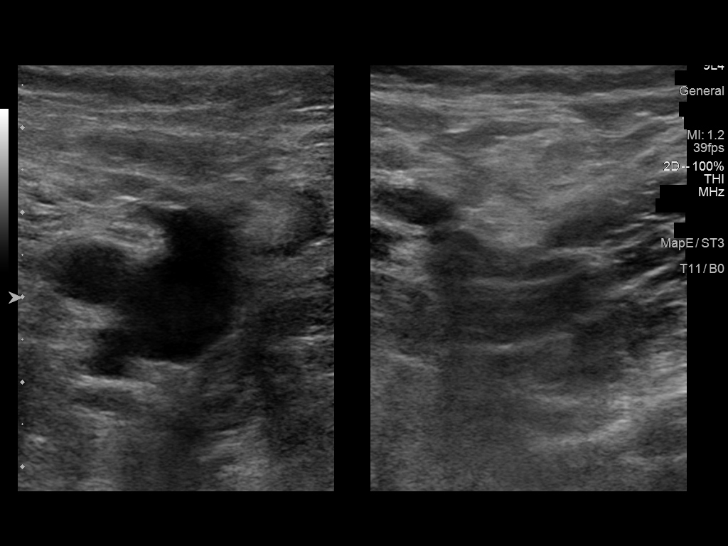
[im 13/34]
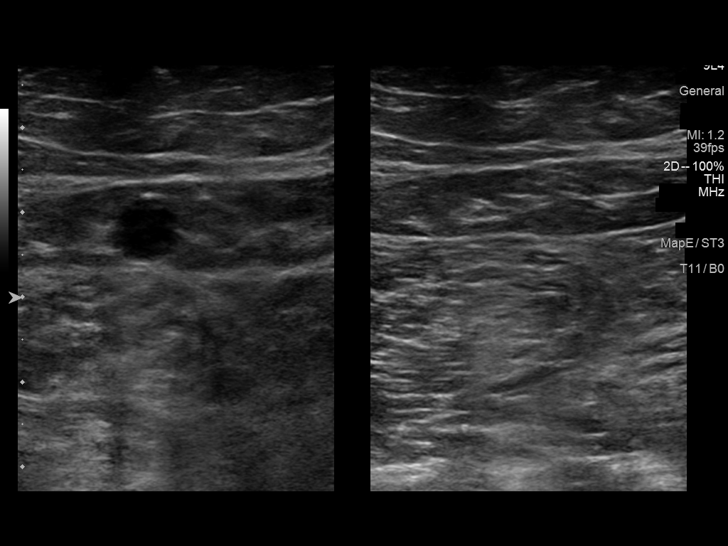
[im 16/34]
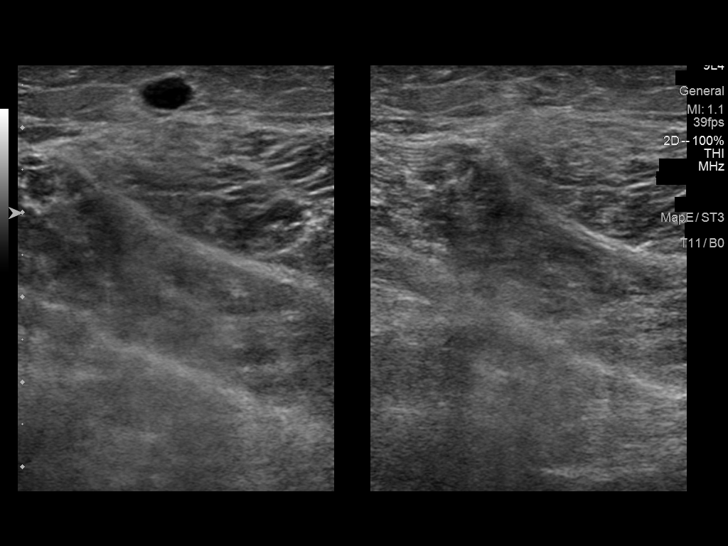
[im 18/34]
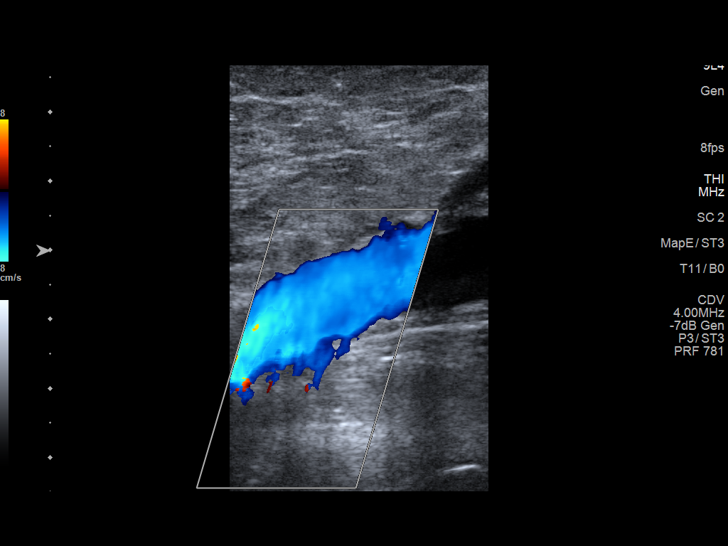
[im 21/34]
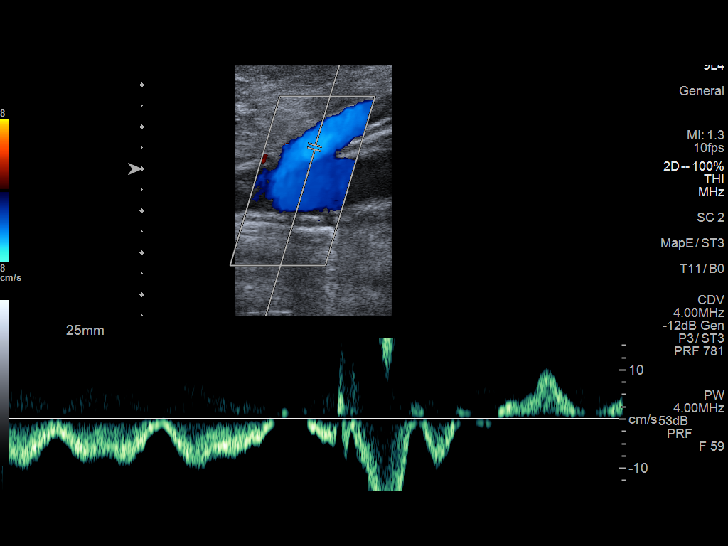
[im 23/34]
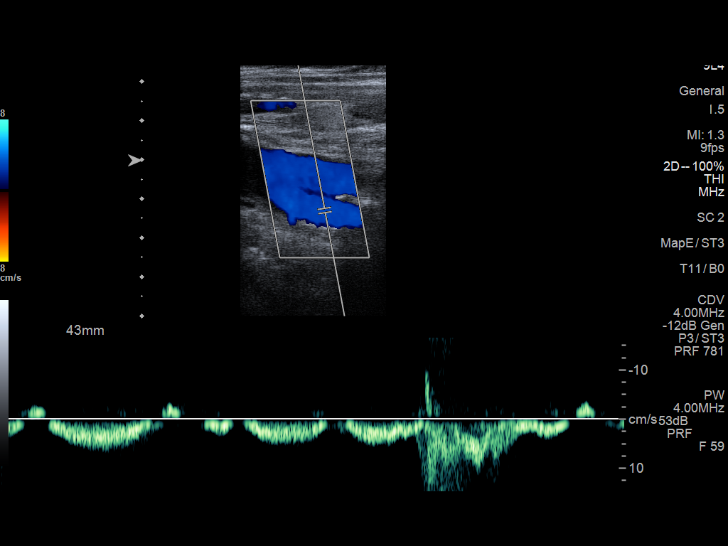
[im 26/34]
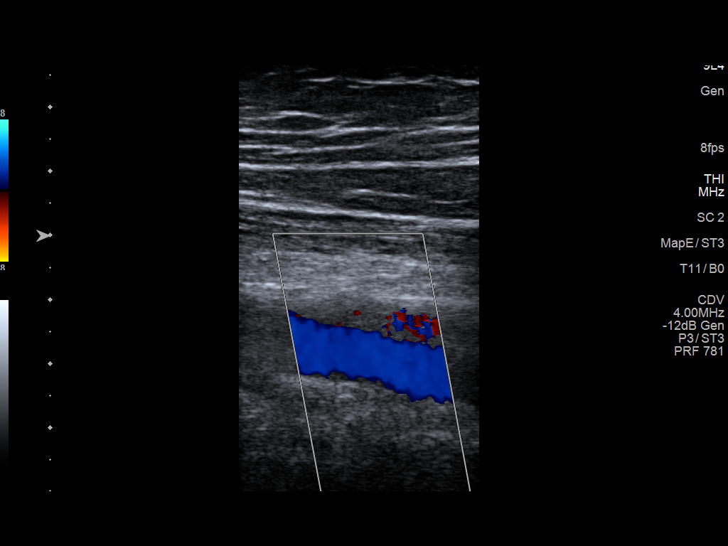
[im 28/34]
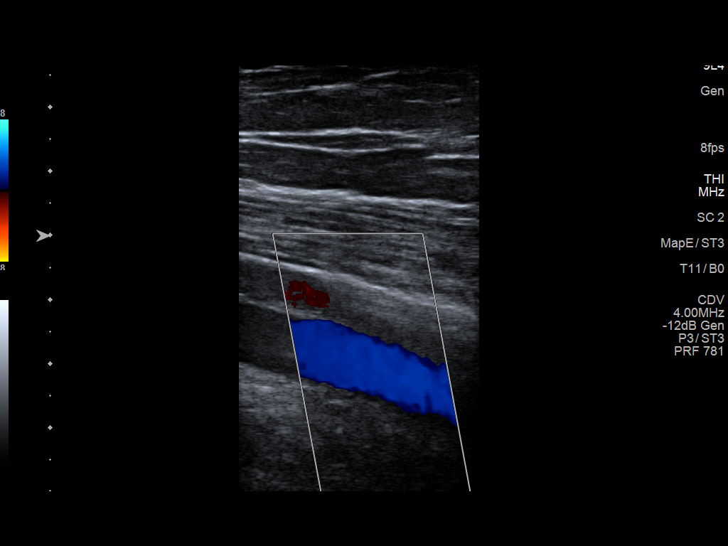
[im 31/34]
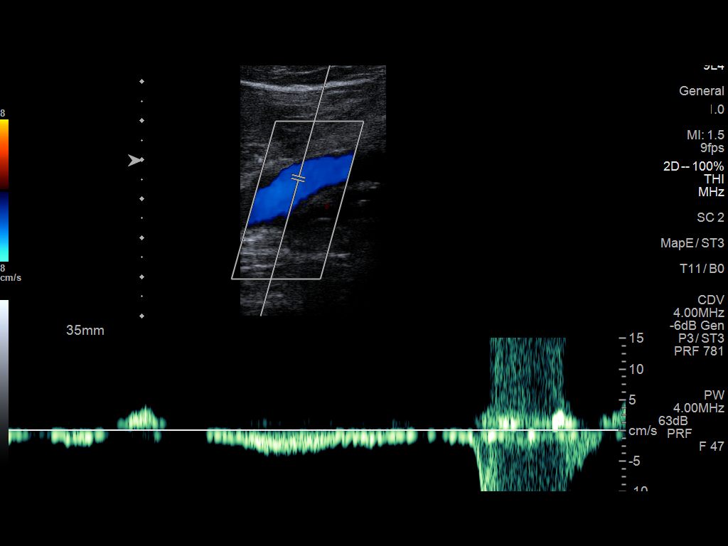
[im 34/34]
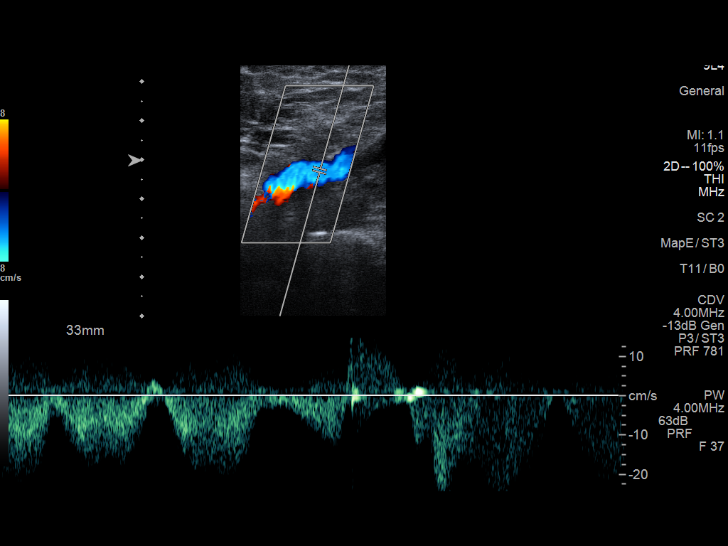

[14 of 24 positions shown; findings below may reference images not displayed]

FINDINGS: VENOUS

Normal compressibility of the common femoral, superficial femoral,
and popliteal veins, as well as the visualized calf veins.
Visualized portions of profunda femoral vein and great saphenous
vein unremarkable. No filling defects to suggest DVT on grayscale or
color Doppler imaging. Doppler waveforms show normal direction of
venous flow, normal respiratory plasticity and response to
augmentation.

Limited views of the contralateral common femoral vein are
unremarkable.

OTHER

None.

Limitations: none
IMPRESSION: No evidence of a deep venous thrombosis of the left lower extremity.

## 2022-01-23 ENCOUNTER — Other Ambulatory Visit: Payer: Self-pay

## 2022-01-23 DIAGNOSIS — I872 Venous insufficiency (chronic) (peripheral): Secondary | ICD-10-CM

## 2022-01-23 NOTE — Progress Notes (Signed)
Error

## 2022-02-01 ENCOUNTER — Encounter: Payer: 59 | Admitting: Physician Assistant

## 2022-02-01 ENCOUNTER — Inpatient Hospital Stay (HOSPITAL_COMMUNITY): Admission: RE | Admit: 2022-02-01 | Payer: 59 | Source: Ambulatory Visit

## 2022-02-01 NOTE — Progress Notes (Deleted)
[facility]   Reason for referral: Swollen *** leg  History of Present Illness  Todd Gamble is a 41 y.o. male who presents with chief complaint: swollen leg.  Patient notes, onset of swelling *** months ago, associated with ***.  The patient has had *** history of DVT, *** history of varicose vein, *** history of venous stasis ulcers, *** history of  Lymphedema and *** history of skin changes in lower legs.  There is *** family history of venous disorders.  The patient has *** used compression stockings in the past.  No past medical history on file.  No past surgical history on file.  Social History   Socioeconomic History   Marital status: Married    Spouse name: Not on file   Number of children: Not on file   Years of education: Not on file   Highest education level: Not on file  Occupational History   Not on file  Tobacco Use   Smoking status: Every Day    Packs/day: 0.50    Types: Cigarettes   Smokeless tobacco: Not on file  Substance and Sexual Activity   Alcohol use: No   Drug use: Not on file    Comment: past opiae addiciton   Sexual activity: Not on file  Other Topics Concern   Not on file  Social History Narrative   Not on file   Social Determinants of Health   Financial Resource Strain: Not on file  Food Insecurity: Not on file  Transportation Needs: Not on file  Physical Activity: Not on file  Stress: Not on file  Social Connections: Not on file  Intimate Partner Violence: Not on file    No family history on file.  Current Outpatient Medications on File Prior to Visit  Medication Sig Dispense Refill   clindamycin (CLEOCIN) 150 MG capsule Take 3 capsules (450 mg total) by mouth 3 (three) times daily. 90 capsule 0   No current facility-administered medications on file prior to visit.    Allergies as of 02/01/2022 - Review Complete 07/16/2020  Allergen Reaction Noted   Other  05/07/2015     ROS:   General:  No  weight loss, Fever, chills  HEENT: No recent headaches, no nasal bleeding, no visual changes, no sore throat  Neurologic: No dizziness, blackouts, seizures. No recent symptoms of stroke or mini- stroke. No recent episodes of slurred speech, or temporary blindness.  Cardiac: No recent episodes of chest pain/pressure, no shortness of breath at rest.  No shortness of breath with exertion.  Denies history of atrial fibrillation or irregular heartbeat  Vascular: No history of rest pain in feet.  No history of claudication.  No history of non-healing ulcer, No history of DVT   Pulmonary: No home oxygen, no productive cough, no hemoptysis,  No asthma or wheezing  Musculoskeletal:  [ ]  Arthritis, [ ]  Low back pain,  [ ]  Joint pain  Hematologic:No history of hypercoagulable state.  No history of easy bleeding.  No history of anemia  Gastrointestinal: No hematochezia or melena,  No gastroesophageal reflux, no trouble swallowing  Urinary: [ ]  chronic Kidney disease, [ ]  on HD - [ ]  MWF or [ ]  TTHS, [ ]  Burning with urination, [ ]  Frequent urination, [ ]  Difficulty urinating;   Skin: No rashes  Psychological: No history of anxiety,  No history of depression  Physical Examination  There were no vitals filed for this visit.  There is no height or  weight on file to calculate BMI.  General:  Alert and oriented, no acute distress HEENT: Normal Neck: No bruit or JVD Pulmonary: Clear to auscultation bilaterally Cardiac: Regular Rate and Rhythm without murmur Abdomen: Soft, non-tender, non-distended, no mass, no scars Skin: No rash Extremity Pulses:  2+ radial, brachial, femoral, dorsalis pedis, posterior tibial pulses bilaterally Musculoskeletal: No deformity or edema  Neurologic: Upper and lower extremity motor 5/5 and symmetric  DATA:  Assessment:  Plan: Mosetta Pigeon PA-C [facility] Office: [phone]  MD in clinic

## 2022-05-15 ENCOUNTER — Other Ambulatory Visit: Payer: Self-pay | Admitting: *Deleted

## 2022-05-15 DIAGNOSIS — I872 Venous insufficiency (chronic) (peripheral): Secondary | ICD-10-CM

## 2022-05-15 DIAGNOSIS — I8393 Asymptomatic varicose veins of bilateral lower extremities: Secondary | ICD-10-CM

## 2022-05-15 NOTE — Progress Notes (Unsigned)
Requested by:  Georgianne Fick, MD [address] [place],  [place] [address]  Reason for consultation: ***    History of Present Illness   Todd Gamble is a 41 y.o. (Feb 24, 1981) male who presents for evaluation of ***  Venous symptoms include: (aching, heavy, tired, throbbing, burning, itching, swelling, bleeding, ulcer)  *** Onset/duration:  ***  Occupation:  *** Aggravating factors: (sitting, standing) Alleviating factors: (elevation) Compression:  *** Helps:  *** Pain medications:  *** Previous vein procedures:  *** History of DVT:  ***  No past medical history on file.  No past surgical history on file.  Social History   Socioeconomic History   Marital status: Married    Spouse name: Not on file   Number of children: Not on file   Years of education: Not on file   Highest education level: Not on file  Occupational History   Not on file  Tobacco Use   Smoking status: Every Day    Packs/day: 0.50    Types: Cigarettes   Smokeless tobacco: Not on file  Substance and Sexual Activity   Alcohol use: No   Drug use: Not on file    Comment: past opiae addiciton   Sexual activity: Not on file  Other Topics Concern   Not on file  Social History Narrative   Not on file   Social Determinants of Health   Financial Resource Strain: Not on file  Food Insecurity: Not on file  Transportation Needs: Not on file  Physical Activity: Not on file  Stress: Not on file  Social Connections: Not on file  Intimate Partner Violence: Not on file   ***No family history on file.  Current Outpatient Medications  Medication Sig Dispense Refill   clindamycin (CLEOCIN) 150 MG capsule Take 3 capsules (450 mg total) by mouth 3 (three) times daily. 90 capsule 0   No current facility-administered medications for this visit.    Allergies  Allergen Reactions   Other     OPIATES - d/t past addiction    ***REVIEW OF SYSTEMS (negative unless checked):    Cardiac:  []  Chest pain or chest pressure? []  Shortness of breath upon activity? []  Shortness of breath when lying flat? []  Irregular heart rhythm?  Vascular:  []  Pain in calf, thigh, or hip brought on by walking? []  Pain in feet at night that wakes you up from your sleep? []  Blood clot in your veins? []  Leg swelling?  Pulmonary:  []  Oxygen at home? []  Productive cough? []  Wheezing?  Neurologic:  []  Sudden weakness in arms or legs? []  Sudden numbness in arms or legs? []  Sudden onset of difficult speaking or slurred speech? []  Temporary loss of vision in one eye? []  Problems with dizziness?  Gastrointestinal:  []  Blood in stool? []  Vomited blood?  Genitourinary:  []  Burning when urinating? []  Blood in urine?  Psychiatric:  []  Major depression  Hematologic:  []  Bleeding problems? []  Problems with blood clotting?  Dermatologic:  []  Rashes or ulcers?  Constitutional:  []  Fever or chills?  Ear/Nose/Throat:  []  Change in hearing? []  Nose bleeds? []  Sore throat?  Musculoskeletal:  []  Back pain? []  Joint pain? []  Muscle pain?   Physical Examination    There were no vitals filed for this visit. There is no height or weight on file to calculate BMI.  General:  WDWN in NAD; vital signs documented above Gait: Not observed HENT: WNL, normocephalic Pulmonary: normal non-labored breathing , without Rales,  rhonchi,  wheezing Cardiac: {Desc; regular/irreg:14544} HR, without  Murmurs {With/Without:20273} carotid bruit*** Abdomen: soft, NT, no masses Skin: {With/Without:20273} rashes Vascular Exam/Pulses:  Right Left  Radial {Exam; arterial pulse strength 0-4:30167} {Exam; arterial pulse strength 0-4:30167}  Ulnar {Exam; arterial pulse strength 0-4:30167} {Exam; arterial pulse strength 0-4:30167}  Femoral {Exam; arterial pulse strength 0-4:30167} {Exam; arterial pulse strength 0-4:30167}  Popliteal {Exam; arterial pulse strength 0-4:30167} {Exam; arterial  pulse strength 0-4:30167}  DP {Exam; arterial pulse strength 0-4:30167} {Exam; arterial pulse strength 0-4:30167}  PT {Exam; arterial pulse strength 0-4:30167} {Exam; arterial pulse strength 0-4:30167}   Extremities: {With/Without:20273} varicose veins, {With/Without:20273} reticular veins, {With/Without:20273} edema, {With/Without:20273} stasis pigmentation, {With/Without:20273} lipodermatosclerosis, {With/Without:20273} ulcers Musculoskeletal: no muscle wasting or atrophy  Neurologic: A&O X 3;  No focal weakness or paresthesias are detected Psychiatric:  The pt has {Desc; normal/abnormal:11317::"Normal"} affect.  Non-invasive Vascular Imaging   BLE Venous Insufficiency Duplex (***):  RLE:  *** DVT and SVT,  *** GSV reflux ***, GSV diameter *** *** SSV reflux ***, *** deep venous reflux  LLE: *** DVT and SVT,  *** GSV reflux ***,  GSV diameter *** *** SSV reflux ***, *** deep venous reflux   Medical Decision Making   Todd Gamble is a 41 y.o. male who presents with: ***LE chronic venous insufficiency, ***varicose veins with complications  Based on the patient's history and examination, I recommend: ***. I discussed with the patient the use of her 20-30 mm thigh high compression stockings and need for 3 month trial of such. The patient will follow up in 3 months with Dr. Marland Kitchen Thank you for allowing [place] to participate in this patient's care.   Graceann Congress, PA-C [facility] Office: [phone]  05/15/2022, 1:48 PM  Clinic MD: [place]/ Randie Heinz

## 2022-05-23 ENCOUNTER — Ambulatory Visit: Payer: 59

## 2022-05-23 ENCOUNTER — Encounter (HOSPITAL_COMMUNITY): Payer: 59

## 2022-07-02 NOTE — Progress Notes (Signed)
Requested by:  Georgianne Fick, MD [address] [place],  [place] [address]  Reason for consultation: varicose veins BLE    History of Present Illness   Todd Gamble is a 41 y.o. (10-Nov-1981) male who presents for evaluation of varicose veins of bilateral lower extremities. He explains that he has had large varicose veins for many years. They have become more painful especially in the right leg. 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 He says his veins become very full and painful especially if he has been up standing for prolonged period of time. He has been wearing compression stockings and compression shorts for about 2-3 years now. He does feel that these help a lot. He does not elevate. He was previously evaluated for an ablation of his veins, but at the time he had other insurance which would not cover the procedure. He has no history of DVT. He does have venous insufficiency in his family- Dad.   Venous symptoms include: aching,throbbing, swelling Onset/duration:  several years  Occupation:  Facilities manager Aggravating factors: sitting, standing, ambulation Alleviating factors: compression Compression:  yes Helps:  yes Pain medications:  no Previous vein procedures:  No History of DVT:  No  History reviewed. No pertinent past medical history.  History reviewed. No pertinent surgical history.  Social History   Socioeconomic History   Marital status: Married    Spouse name: Not on file   Number of children: Not on file   Years of education: Not on file   Highest education level: Not on file  Occupational History   Not on file  Tobacco Use   Smoking status: Every Day    Packs/day: 0.50    Types: Cigarettes   Smokeless tobacco: Not on file  Substance and Sexual Activity   Alcohol use: No   Drug use: Not on file    Comment: past opiae addiciton   Sexual activity: Not on file  Other  Topics Concern   Not on file  Social History Narrative   Not on file   Social Determinants of Health   Financial Resource Strain: Not on file  Food Insecurity: Not on file  Transportation Needs: Not on file  Physical Activity: Not on file  Stress: Not on file  Social Connections: Not on file  Intimate Partner Violence: Not on file   Family History  Problem Relation Age of Onset   Diabetes Father    Varicose Veins Father     Current Outpatient Medications  Medication Sig Dispense Refill   methadone (DOLOPHINE) 10 MG tablet 2 tablets     clindamycin (CLEOCIN) 150 MG capsule Take 3 capsules (450 mg total) by mouth 3 (three) times daily. (Patient not taking: Reported on 07/06/2022) 90 capsule 0   No current facility-administered medications for this visit.    Allergies  Allergen Reactions   Other     OPIATES - d/t past addiction    REVIEW OF SYSTEMS (negative unless checked):   Cardiac:  []  Chest pain or chest pressure? []  Shortness of breath upon activity? []  Shortness of breath when lying flat? []  Irregular heart rhythm?  Vascular:  []  Pain in calf, thigh, or hip brought on by walking? []  Pain in feet at night that wakes you up from your sleep? []  Blood clot in your veins? [x]  Leg swelling?  Pulmonary:  []  Oxygen at home? []  Productive cough? []  Wheezing?  Neurologic:  []  Sudden weakness in arms or legs? []  Sudden numbness in arms or legs? []  Sudden onset of difficult speaking or slurred speech? []  Temporary loss of vision in one eye? []  Problems with dizziness?  Gastrointestinal:  []  Blood in stool? []  Vomited blood?  Genitourinary:  []  Burning when urinating? []  Blood in urine?  Psychiatric:  []  Major depression  Hematologic:  []  Bleeding problems? []  Problems with blood clotting?  Dermatologic:  []  Rashes or ulcers?  Constitutional:  []  Fever or chills?  Ear/Nose/Throat:  []  Change in hearing? []  Nose bleeds? []  Sore  throat?  Musculoskeletal:  []  Back pain? []  Joint pain? []  Muscle pain?   Physical Examination     Vitals:   07/06/22 1338  BP: 118/78  Pulse: 63  Resp: 16  Temp: 98.3 F (36.8 C)  TempSrc: Temporal  SpO2: 96%  Weight: 244 lb (110.7 kg)  Height: 6' (1.829 m)   Body mass index is 33.09 kg/m.  General:  WDWN in NAD; vital signs documented above Gait: Normal HENT: WNL, normocephalic Pulmonary: normal non-labored breathing without wheezing Cardiac: regular HR Vascular Exam/Pulses:2+ bilateral DP, feet warm and well perfused Extremities: with large varicose veins of medial right thigh that traverse cross right knee and medial and anterior right leg, left medial thigh and leg, with reticular veins of bilateral medial ankles, with edema, without stasis pigmentation, without lipodermatosclerosis, without ulcers Musculoskeletal: no muscle wasting or atrophy  Neurologic: A&O X 3;  No focal weakness or paresthesias are detected Psychiatric:  The pt has Normal affect.  Non-invasive Vascular Imaging   BLE Venous Insufficiency Duplex (07/06/22):  RLE:  No DVT, SVT in GSV GSV reflux SFJ to  calf GSV diameter 0.643- 0.94 SSV reflux in mid and proximal calf CFV, FV deep venous reflux   Medical Decision Making   Todd Gamble is a 41 y.o. male who presents with: RLE chronic venous insufficiency with large varicose veins with pain in right leg. Duplex today shows no DVT. He does have SVT in GSV and a varicosity of the GSV. 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 He additionally has Deep venous insufficiency. His veins are of adequate size to be considered for venous ablation. Based on the patient's history and examination, I recommend: daily elevation, continued compression stocking use, refraining from prolonged sitting or standing, and exercise. I discussed with the patient the use of her 20-30 mm thigh high compression stockings. He has been wearing compression  stockings for almost 3 years. However he was measured today and purchased a pair of the thigh high at today's visit.  The patient will follow up with one of the MD's to further be evaluated and discuss the venous lazer ablation. Additionally I discussed with patient option for stab phlebectomy and he will be evaluated for this also when he returns for follow up  Thank you for allowing to participate in this patient's care.   , PA-C [facility] Office: [phone]  07/06/2022, 2:11 PM  Clinic MD: 

## 2022-07-06 ENCOUNTER — Ambulatory Visit (INDEPENDENT_AMBULATORY_CARE_PROVIDER_SITE_OTHER): Payer: 59 | Admitting: Physician Assistant

## 2022-07-06 ENCOUNTER — Ambulatory Visit (HOSPITAL_COMMUNITY)
Admission: RE | Admit: 2022-07-06 | Discharge: 2022-07-06 | Disposition: A | Discharge status: Home / Self Care | Payer: 59 | Source: Ambulatory Visit | Attending: Physician Assistant | Admitting: Physician Assistant

## 2022-07-06 VITALS — BP 118/78 | HR 63 | Temp 98.3°F | Resp 16 | Ht 72.0 in | Wt 244.0 lb

## 2022-07-06 DIAGNOSIS — I872 Venous insufficiency (chronic) (peripheral): Secondary | ICD-10-CM

## 2022-07-06 DIAGNOSIS — I8311 Varicose veins of right lower extremity with inflammation: Secondary | ICD-10-CM

## 2022-07-06 DIAGNOSIS — I8393 Asymptomatic varicose veins of bilateral lower extremities: Secondary | ICD-10-CM | POA: Insufficient documentation

## 2022-07-11 ENCOUNTER — Ambulatory Visit: Payer: 59 | Admitting: Vascular Surgery

## 2022-08-08 ENCOUNTER — Ambulatory Visit (INDEPENDENT_AMBULATORY_CARE_PROVIDER_SITE_OTHER): Payer: 59 | Admitting: Vascular Surgery

## 2022-08-08 ENCOUNTER — Encounter: Payer: Self-pay | Admitting: Vascular Surgery

## 2022-08-08 VITALS — BP 105/70 | HR 73 | Temp 98.0°F | Resp 18 | Ht 72.0 in | Wt 244.3 lb

## 2022-08-08 DIAGNOSIS — I872 Venous insufficiency (chronic) (peripheral): Secondary | ICD-10-CM | POA: Diagnosis not present

## 2022-08-08 DIAGNOSIS — I8311 Varicose veins of right lower extremity with inflammation: Secondary | ICD-10-CM

## 2022-08-08 NOTE — Progress Notes (Signed)
REASON FOR VISIT:   Follow-up of chronic venous insufficiency.  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  He has CEAP C2 venous disease.  He is having significant symptoms from venous hypertension.  We have again discussed the importance of intermittent leg elevation and the proper positioning for this.  In addition, I have encouraged him to continue to wear his thigh-high compression stockings with a gradient of 20 to 30 mmHg.  We discussed importance of exercise specifically walking and water aerobics.  I encouraged him to avoid prolonged sitting and standing.  We also discussed the importance of maintaining a healthy weight.  Given that he is having persistent symptoms I think he would be a good candidate for laser ablation of the right great saphenous vein with greater than 20 stab phlebectomies.  He does have some clot in the saphenous vein in the mid thigh and at the saphenofemoral junction.  If we cannot get through that area with a wire we may have to ablate below this and then separately a blade above this.  I want to stay well away from the saphenofemoral junction where he has some clot.  I have discussed the indications for endovenous laser ablation of the right GSV, that is to lower the pressure in the veins and potentially help relieve the symptoms from venous hypertension.  I have also discussed alternative options such as conservative treatment as described above. I have discussed the potential complications of the procedure, including bleeding, bruising, leg swelling, deep venous thrombosis (<1% risk), or failure of the vein to close <1% risk).  I have also explained that venous insufficiency is a chronic disease, and that the patient is at risk for recurrent varicose veins in the future.  All of the patient's questions were encouraged and answered. They are agreeable to proceed.    I have discussed with the patient the indications for stab phlebectomy.  I have explained to the patient that that will have small scars from the stab incisions.  I explained that the other risks include bruising, bleeding, and phlebitis.    HPI:   Todd Gamble is a pleasant 41 y.o. male who was seen by Graceann Congress, PA last on 07/06/2022 with painful varicose veins of the right lower extremity.  The patient was experiencing aching pain throbbing and swelling in the right leg.  This been going on for several years.  He works on his feet standing quite a bit and also sitting.  He had been wearing thigh-high compression stockings which helps some.  He had been elevating his legs.  He had significant deep venous reflux and superficial venous reflux on the right.  He comes in for follow-up visit to discuss stab phlebectomy.  On my history, the patient describes significant aching pain and heaviness in the right leg which is aggravated by standing and relieved with elevation.  He works long shifts, 12 to 13 hours.  His symptoms are worse at the end of the day.  His symptoms are almost unbearable at the end of the day.  He has been wearing his thigh-high compression stockings which helps some.  He also tries to elevate his legs at night which helps some.  He is unaware of any previous history of DVT.  He is not having significant symptoms on the left side.  History reviewed. No pertinent past medical history.  Family History  Problem Relation  Age of Onset   Diabetes Father    Varicose Veins Father     SOCIAL HISTORY: Social History   Tobacco Use   Smoking status: Every Day    Packs/day: 0.50    Types: Cigarettes   Smokeless tobacco: Not on file  Substance Use Topics   Alcohol use: No    Allergies  Allergen Reactions   Other     OPIATES - d/t past addiction    Current Outpatient Medications  Medication Sig Dispense Refill   methadone (DOLOPHINE) 10 MG tablet 2 tablets      clindamycin (CLEOCIN) 150 MG capsule Take 3 capsules (450 mg total) by mouth 3 (three) times daily. (Patient not taking: Reported on 07/06/2022) 90 capsule 0   No current facility-administered medications for this visit.    REVIEW OF SYSTEMS:  [X]  denotes positive finding, [ ]  denotes negative finding Cardiac  Comments:  Chest pain or chest pressure:    Shortness of breath upon exertion:    Short of breath when lying flat:    Irregular heart rhythm:        Vascular    Pain in calf, thigh, or hip brought on by ambulation:    Pain in feet at night that wakes you up from your sleep:     Blood clot in your veins:    Leg swelling:  x       Pulmonary    Oxygen at home:    Productive cough:     Wheezing:         Neurologic    Sudden weakness in arms or legs:     Sudden numbness in arms or legs:     Sudden onset of difficulty speaking or slurred speech:    Temporary loss of vision in one eye:     Problems with dizziness:         Gastrointestinal    Blood in stool:     Vomited blood:         Genitourinary    Burning when urinating:     Blood in urine:        Psychiatric    Major depression:         Hematologic    Bleeding problems:    Problems with blood clotting too easily:        Skin    Rashes or ulcers:        Constitutional    Fever or chills:     PHYSICAL EXAM:   Vitals:   08/08/22 1612  BP: 105/70  Pulse: 73  Resp: 18  Temp: 98 F (36.7 C)  TempSrc: Temporal  SpO2: 99%  Weight: 244 lb 4.8 oz (110.8 kg)  Height: 6' (1.829 m)    GENERAL: The patient is a well-nourished male, in no acute distress. The vital signs are documented above. CARDIAC: There is a regular rate and rhythm.  VASCULAR: I do not detect carotid bruits. He has palpable pedal pulses bilaterally. He has some large varicose veins in his medial right thigh and right leg around the knee.  He also has varicosities in the calf.       I did look at the right great saphenous vein myself  with the [employer].  He has significant reflux down to the knee.  The vein is significantly dilated.  I did note thrombus in the mid thigh and also at the saphenofemoral junction.  PULMONARY: There is good air exchange bilaterally without wheezing or rales. ABDOMEN:  Soft and non-tender with normal pitched bowel sounds.  MUSCULOSKELETAL: There are no major deformities or cyanosis. NEUROLOGIC: No focal weakness or paresthesias are detected. SKIN: There are no ulcers or rashes noted. PSYCHIATRIC: The patient has a normal affect.  DATA:    VENOUS DUPLEX: I have reviewed the venous duplex scan that was done on 07/06/2022.  This was of the right lower extremity only.  There was no evidence of DVT.  There was deep venous reflux in the common femoral vein and proximal femoral vein.  There was superficial venous reflux in the right great saphenous vein from the saphenofemoral junction to the proximal calf.  Diameters of the vein ranged from 6.5-9 mm.    A total of 42 minutes was spent on this visit. 22 minutes was face to face time. More than 50% of the time was spent on counseling and coordinating with the patient.    Waverly [facility] [phone]
